# Patient Record
Sex: Female | Born: 1950 | Hispanic: No | Marital: Married | Smoking: Never smoker
Health system: Southern US, Community
[De-identification: ages and names within clinical notes are randomized; demographics above are authoritative.]

## PROBLEM LIST (undated history)

## (undated) DIAGNOSIS — I10 Essential (primary) hypertension: Secondary | ICD-10-CM

## (undated) DIAGNOSIS — E119 Type 2 diabetes mellitus without complications: Secondary | ICD-10-CM

## (undated) DIAGNOSIS — E669 Obesity, unspecified: Secondary | ICD-10-CM

## (undated) DIAGNOSIS — J449 Chronic obstructive pulmonary disease, unspecified: Secondary | ICD-10-CM

## (undated) DIAGNOSIS — I509 Heart failure, unspecified: Secondary | ICD-10-CM

## (undated) HISTORY — PX: CHOLECYSTECTOMY: SHX55

---

## 2015-08-02 ENCOUNTER — Other Ambulatory Visit (HOSPITAL_COMMUNITY): Payer: Medicare Other

## 2015-08-02 ENCOUNTER — Encounter (HOSPITAL_COMMUNITY): Payer: Self-pay | Admitting: Emergency Medicine

## 2015-08-02 ENCOUNTER — Emergency Department (HOSPITAL_COMMUNITY): Payer: Medicare Other

## 2015-08-02 ENCOUNTER — Inpatient Hospital Stay (HOSPITAL_COMMUNITY)
Admission: EM | Admit: 2015-08-02 | Discharge: 2015-08-04 | DRG: 194 | Disposition: A | Discharge status: Home / Self Care | Payer: Medicare Other | Attending: Internal Medicine | Admitting: Internal Medicine

## 2015-08-02 DIAGNOSIS — Z6841 Body Mass Index (BMI) 40.0 and over, adult: Secondary | ICD-10-CM | POA: Diagnosis not present

## 2015-08-02 DIAGNOSIS — Z87891 Personal history of nicotine dependence: Secondary | ICD-10-CM

## 2015-08-02 DIAGNOSIS — E785 Hyperlipidemia, unspecified: Secondary | ICD-10-CM | POA: Diagnosis present

## 2015-08-02 DIAGNOSIS — Z794 Long term (current) use of insulin: Secondary | ICD-10-CM

## 2015-08-02 DIAGNOSIS — I10 Essential (primary) hypertension: Secondary | ICD-10-CM | POA: Diagnosis present

## 2015-08-02 DIAGNOSIS — R600 Localized edema: Secondary | ICD-10-CM | POA: Diagnosis present

## 2015-08-02 DIAGNOSIS — R0902 Hypoxemia: Secondary | ICD-10-CM

## 2015-08-02 DIAGNOSIS — J189 Pneumonia, unspecified organism: Secondary | ICD-10-CM | POA: Diagnosis present

## 2015-08-02 DIAGNOSIS — E669 Obesity, unspecified: Secondary | ICD-10-CM | POA: Diagnosis present

## 2015-08-02 DIAGNOSIS — J449 Chronic obstructive pulmonary disease, unspecified: Secondary | ICD-10-CM | POA: Diagnosis present

## 2015-08-02 DIAGNOSIS — E119 Type 2 diabetes mellitus without complications: Secondary | ICD-10-CM | POA: Diagnosis present

## 2015-08-02 DIAGNOSIS — R0600 Dyspnea, unspecified: Secondary | ICD-10-CM | POA: Diagnosis not present

## 2015-08-02 DIAGNOSIS — G4730 Sleep apnea, unspecified: Secondary | ICD-10-CM | POA: Diagnosis present

## 2015-08-02 DIAGNOSIS — Z79899 Other long term (current) drug therapy: Secondary | ICD-10-CM | POA: Diagnosis not present

## 2015-08-02 HISTORY — DX: Chronic obstructive pulmonary disease, unspecified: J44.9

## 2015-08-02 HISTORY — DX: Type 2 diabetes mellitus without complications: E11.9

## 2015-08-02 HISTORY — DX: Essential (primary) hypertension: I10

## 2015-08-02 LAB — GLUCOSE, CAPILLARY
GLUCOSE-CAPILLARY: 270 mg/dL — AB (ref 65–99)
GLUCOSE-CAPILLARY: 264 mg/dL — AB (ref 65–99)
GLUCOSE-CAPILLARY: 238 mg/dL — AB (ref 65–99)
Glucose-Capillary: 185 mg/dL — ABNORMAL HIGH (ref 65–99)

## 2015-08-02 LAB — CBC WITH DIFFERENTIAL/PLATELET
BASOS PCT: 0 % (ref 0–1)
Basophils Absolute: 0 10*3/uL (ref 0.0–0.1)
EOS ABS: 0.3 10*3/uL (ref 0.0–0.7)
Eosinophils Relative: 4 % (ref 0–5)
HEMATOCRIT: 42 % (ref 36.0–46.0)
HEMOGLOBIN: 13.6 g/dL (ref 12.0–15.0)
Lymphocytes Relative: 21 % (ref 12–46)
Lymphs Abs: 1.6 10*3/uL (ref 0.7–4.0)
MCH: 27.9 pg (ref 26.0–34.0)
MCHC: 32.4 g/dL (ref 30.0–36.0)
MCV: 86.1 fL (ref 78.0–100.0)
MONOS PCT: 7 % (ref 3–12)
Monocytes Absolute: 0.6 10*3/uL (ref 0.1–1.0)
NEUTROS ABS: 5.2 10*3/uL (ref 1.7–7.7)
NEUTROS PCT: 68 % (ref 43–77)
Platelets: 245 10*3/uL (ref 150–400)
RBC: 4.88 MIL/uL (ref 3.87–5.11)
RDW: 15.4 % (ref 11.5–15.5)
WBC: 7.7 10*3/uL (ref 4.0–10.5)

## 2015-08-02 LAB — COMPREHENSIVE METABOLIC PANEL
ALBUMIN: 3 g/dL — AB (ref 3.5–5.0)
ALK PHOS: 90 U/L (ref 38–126)
ALT: 25 U/L (ref 14–54)
AST: 26 U/L (ref 15–41)
Anion gap: 8 (ref 5–15)
BILIRUBIN TOTAL: 0.6 mg/dL (ref 0.3–1.2)
BUN: 11 mg/dL (ref 6–20)
CALCIUM: 8.9 mg/dL (ref 8.9–10.3)
CO2: 27 mmol/L (ref 22–32)
CREATININE: 0.77 mg/dL (ref 0.44–1.00)
Chloride: 104 mmol/L (ref 101–111)
GFR calc Af Amer: >60 mL/min (ref >60)
GFR calc non Af Amer: >60 mL/min (ref >60)
GLUCOSE: 245 mg/dL — AB (ref 65–99)
Potassium: 4 mmol/L (ref 3.5–5.1)
SODIUM: 139 mmol/L (ref 135–145)
TOTAL PROTEIN: 6.4 g/dL — AB (ref 6.5–8.1)

## 2015-08-02 LAB — BRAIN NATRIURETIC PEPTIDE: B NATRIURETIC PEPTIDE 5: 36.9 pg/mL (ref 0.0–100.0)

## 2015-08-02 LAB — STREP PNEUMONIAE URINARY ANTIGEN: Strep Pneumo Urinary Antigen: NEGATIVE

## 2015-08-02 LAB — HIV ANTIBODY (ROUTINE TESTING W REFLEX): HIV SCREEN 4TH GENERATION: NONREACTIVE

## 2015-08-02 LAB — I-STAT TROPONIN, ED: Troponin i, poc: 0.01 ng/mL (ref 0.00–0.08)

## 2015-08-02 MED ORDER — ENOXAPARIN SODIUM 40 MG/0.4ML ~~LOC~~ SOLN
40.0000 mg | Freq: Every day | SUBCUTANEOUS | Status: DC
  Administered 2015-08-02 – 2015-08-04 (×3): 40 mg via SUBCUTANEOUS
  Filled 2015-08-02 (×3): qty 0.4

## 2015-08-02 MED ORDER — DEXTROSE 5 % IV SOLN
500.0000 mg | Freq: Once | INTRAVENOUS | Status: AC
  Administered 2015-08-02: 500 mg via INTRAVENOUS
  Filled 2015-08-02: qty 500

## 2015-08-02 MED ORDER — INSULIN GLARGINE 100 UNIT/ML ~~LOC~~ SOLN
40.0000 [IU] | Freq: Two times a day (BID) | SUBCUTANEOUS | Status: DC
  Administered 2015-08-02 – 2015-08-04 (×4): 40 [IU] via SUBCUTANEOUS
  Filled 2015-08-02 (×7): qty 0.4

## 2015-08-02 MED ORDER — LOSARTAN POTASSIUM 50 MG PO TABS
100.0000 mg | ORAL_TABLET | Freq: Every day | ORAL | Status: DC
  Administered 2015-08-02 – 2015-08-04 (×3): 100 mg via ORAL
  Filled 2015-08-02 (×3): qty 2

## 2015-08-02 MED ORDER — AZITHROMYCIN 250 MG PO TABS
500.0000 mg | ORAL_TABLET | ORAL | Status: DC
  Administered 2015-08-03 – 2015-08-04 (×2): 500 mg via ORAL
  Filled 2015-08-02 (×3): qty 2

## 2015-08-02 MED ORDER — INSULIN ASPART 100 UNIT/ML ~~LOC~~ SOLN
0.0000 [IU] | Freq: Three times a day (TID) | SUBCUTANEOUS | Status: DC
  Administered 2015-08-02: 11 [IU] via SUBCUTANEOUS
  Administered 2015-08-02: 4 [IU] via SUBCUTANEOUS
  Administered 2015-08-02: 7 [IU] via SUBCUTANEOUS
  Administered 2015-08-03 (×2): 3 [IU] via SUBCUTANEOUS
  Administered 2015-08-03: 6 [IU] via SUBCUTANEOUS
  Administered 2015-08-04: 3 [IU] via SUBCUTANEOUS
  Administered 2015-08-04: 4 [IU] via SUBCUTANEOUS

## 2015-08-02 MED ORDER — METFORMIN HCL 500 MG PO TABS
1000.0000 mg | ORAL_TABLET | Freq: Two times a day (BID) | ORAL | Status: DC
  Administered 2015-08-02 – 2015-08-04 (×4): 1000 mg via ORAL
  Filled 2015-08-02 (×5): qty 2

## 2015-08-02 MED ORDER — IBUPROFEN 600 MG PO TABS
600.0000 mg | ORAL_TABLET | Freq: Four times a day (QID) | ORAL | Status: DC | PRN
  Administered 2015-08-02 – 2015-08-04 (×4): 600 mg via ORAL
  Filled 2015-08-02 (×4): qty 1

## 2015-08-02 MED ORDER — ALBUTEROL SULFATE (2.5 MG/3ML) 0.083% IN NEBU: 2.5000 mg | INHALATION_SOLUTION | Freq: Four times a day (QID) | RESPIRATORY_TRACT | Status: DC | PRN

## 2015-08-02 MED ORDER — DEXTROSE 5 % IV SOLN
1.0000 g | INTRAVENOUS | Status: DC
  Administered 2015-08-03 – 2015-08-04 (×2): 1 g via INTRAVENOUS
  Filled 2015-08-02 (×3): qty 10

## 2015-08-02 MED ORDER — ASPIRIN EC 81 MG PO TBEC
81.0000 mg | DELAYED_RELEASE_TABLET | Freq: Every day | ORAL | Status: DC
  Administered 2015-08-02 – 2015-08-04 (×3): 81 mg via ORAL
  Filled 2015-08-02 (×3): qty 1

## 2015-08-02 MED ORDER — BUDESONIDE-FORMOTEROL FUMARATE 160-4.5 MCG/ACT IN AERO
2.0000 | INHALATION_SPRAY | Freq: Two times a day (BID) | RESPIRATORY_TRACT | Status: DC
  Administered 2015-08-02 – 2015-08-04 (×5): 2 via RESPIRATORY_TRACT
  Filled 2015-08-02: qty 6

## 2015-08-02 MED ORDER — DEXTROSE 5 % IV SOLN
1.0000 g | Freq: Once | INTRAVENOUS | Status: AC
  Administered 2015-08-02: 1 g via INTRAVENOUS
  Filled 2015-08-02: qty 10

## 2015-08-02 MED ORDER — IPRATROPIUM-ALBUTEROL 0.5-2.5 (3) MG/3ML IN SOLN
3.0000 mL | Freq: Once | RESPIRATORY_TRACT | Status: AC
  Administered 2015-08-02: 3 mL via RESPIRATORY_TRACT
  Filled 2015-08-02: qty 3

## 2015-08-02 MED ORDER — INSULIN ASPART 100 UNIT/ML ~~LOC~~ SOLN
6.0000 [IU] | Freq: Three times a day (TID) | SUBCUTANEOUS | Status: DC
  Administered 2015-08-02 – 2015-08-03 (×6): 6 [IU] via SUBCUTANEOUS

## 2015-08-02 MED ORDER — CETYLPYRIDINIUM CHLORIDE 0.05 % MT LIQD
7.0000 mL | Freq: Two times a day (BID) | OROMUCOSAL | Status: DC
  Administered 2015-08-02 – 2015-08-03 (×4): 7 mL via OROMUCOSAL

## 2015-08-02 MED ORDER — AMLODIPINE BESYLATE 10 MG PO TABS
10.0000 mg | ORAL_TABLET | Freq: Every day | ORAL | Status: DC
  Administered 2015-08-02 – 2015-08-04 (×3): 10 mg via ORAL
  Filled 2015-08-02 (×3): qty 1

## 2015-08-02 NOTE — Progress Notes (Signed)
Admission Note  Patient arrived to floor in room 5W06 via bed from ED. Patient alert and oriented X 4. Vitals signs  Oral temperature 98.2 F       Blood pressure 150/54       Pulse 787       RR 18       SpO2 96 % on 2L of Oxygen. Denies pain. Skin intact, no pressure ulcer noted in sacral area. Telemetry monitor #16 placed.  Patient's ID armband verified with patient/ family, and in place. Information packet given to patient/ family. Fall risk assessed, SR up X2, patient/ family able to verbalize understanding of risks associated with falls and to call nurse or staff to assist before getting out of bed. Patient/ family oriented to room and equipment. Call bell within reach.

## 2015-08-02 NOTE — Progress Notes (Signed)
Patient is seen & examined. Please see today's H&P for the details. 64 y/o female with PMH of DM, COPD, HTN presented with cough, SOB, Fever. Admitted with developing PNA. Started on IV antibiotics. She is clinically stable.  -Patient also reports chronic DOE. No acute chest pains. We will obtain echo. ? Underlying OSA. Also need outpatient PFTs BURIEV, ULUGBEK N 9:47 AM

## 2015-08-02 NOTE — Progress Notes (Signed)
Called and received report on patient from [facility] ED nurse.

## 2015-08-02 NOTE — Care Management Note (Signed)
Case Management Note  Patient Details  Name: Monay Houlton MRN: [id number] Date of Birth: [birth year]  Subjective/Objective:                 Patient from home with spouse. She currently does no have home oxygen, she receives her diabetes supplies from [facility]. Has walker and a cane. Pateint states that she was about to start care with a new PCP in [place] and has an appointment tomorrow, but could not remember the name of the MD. She stated she would get the number from her sister so her follow up could be rescheduled.    Action/Plan:  Will anticipate home with DME oxygen unless clinical improvement made.  Expected Discharge Date:  08/04/15               Expected Discharge Plan:  Home w Home Health Services  In-House Referral:     Discharge planning Services  CM Consult  Post Acute Care Choice:    Choice offered to:  Patient  DME Arranged:    DME Agency:     HH Arranged:    HH Agency:     Status of Service:  In process, will continue to follow  Medicare Important Message Given:    Date Medicare IM Given:    Medicare IM give by:    Date Additional Medicare IM Given:    Additional Medicare Important Message give by:     If discussed at Long Length of Stay Meetings, dates discussed:    Additional Comments:  Lawerance Sabal, RN 08/02/2015, 12:27 PM

## 2015-08-02 NOTE — H&P (Signed)
[facility] History and Physical  Dorothy Hammond ZOX:[id number] DOB: 04-Jul-1951 DOA: 08/02/2015  Referring physician: EDP PCP: No primary care provider on file.   Chief Complaint: Cough   HPI: Dorothy Hammond is a 64 y.o. female with h/o DM and COPD.  Patient presents to the ED with c/o cough, SOB.  Symptoms have been persistent and worsening since onset 1.5 weeks ago.  Had a grand-daughter with similar symptoms who was treated with ABx starting 2 weeks ago.  Cough is productive of yellowish-white phlegm.  Has associated URI symptoms and fever with Tm of 103.  Review of Systems: Systems reviewed.  As above, otherwise negative  Past Medical History  Diagnosis Date  . Diabetes mellitus without complication   . COPD (chronic obstructive pulmonary disease)   . HTN (hypertension)    Past Surgical History  Procedure Laterality Date  . Cholecystectomy    . Cesarean section     Social History:  reports that she has never smoked. She does not have any smokeless tobacco history on file. She reports that she does not drink alcohol or use illicit drugs.  No Known Allergies  No family history on file.   Prior to Admission medications   Medication Sig Start Date End Date Taking? Authorizing Provider  albuterol (PROAIR HFA) 108 (90 BASE) MCG/ACT inhaler Inhale 2 puffs into the lungs every 6 (six) hours as needed for wheezing or shortness of breath.   Yes Historical Provider, MD  amLODipine (NORVASC) 10 MG tablet Take 10 mg by mouth daily.   Yes Historical Provider, MD  aspirin EC 81 MG tablet Take 81 mg by mouth daily.   Yes Historical Provider, MD  budesonide-formoterol (SYMBICORT) 160-4.5 MCG/ACT inhaler Inhale 2 puffs into the lungs 2 (two) times daily.   Yes Historical Provider, MD  ibuprofen (ADVIL,MOTRIN) 200 MG tablet Take 600 mg by mouth every 6 (six) hours as needed for fever or moderate pain.   Yes Historical Provider, MD  insulin aspart (NOVOLOG FLEXPEN) 100 UNIT/ML FlexPen Inject 20  Units into the skin 3 (three) times daily as needed (when eating a meal).   Yes Historical Provider, MD  insulin glargine (LANTUS) 100 unit/mL SOPN Inject 40 Units into the skin 2 (two) times daily.   Yes Historical Provider, MD  losartan (COZAAR) 100 MG tablet Take 100 mg by mouth daily.   Yes Historical Provider, MD  metFORMIN (GLUCOPHAGE) 500 MG tablet Take 1,000 mg by mouth 2 (two) times daily with a meal.   Yes Historical Provider, MD  trolamine salicylate (ASPERCREME) 10 % cream Apply 1 application topically 2 (two) times daily as needed for muscle pain.   Yes Historical Provider, MD   Physical Exam: Filed Vitals:   08/02/15 0446  BP: 142/55  Pulse: 74  Temp:   Resp: 16    BP 142/55 mmHg  Pulse 74  Temp(Src) 98.9 F (37.2 C) (Oral)  Resp 16  SpO2 93%  General Appearance:    Alert, oriented, no distress, appears stated age  Head:    Normocephalic, atraumatic  Eyes:    PERRL, EOMI, sclera non-icteric        Nose:   Nares without drainage or epistaxis. Mucosa, turbinates normal  Throat:   Moist mucous membranes. Oropharynx without erythema or exudate.  Neck:   Supple. No carotid bruits.  No thyromegaly.  No lymphadenopathy.   Back:     No CVA tenderness, no spinal tenderness  Lungs:     Clear to auscultation bilaterally, without  wheezes, rhonchi or rales  Chest wall:    No tenderness to palpitation  Heart:    Regular rate and rhythm without murmurs, gallops, rubs  Abdomen:     Soft, non-tender, nondistended, normal bowel sounds, no organomegaly  Genitalia:    deferred  Rectal:    deferred  Extremities:   No clubbing, cyanosis or edema.  Pulses:   2+ and symmetric all extremities  Skin:   Skin color, texture, turgor normal, no rashes or lesions  Lymph nodes:   Cervical, supraclavicular, and axillary nodes normal  Neurologic:   CNII-XII intact. Normal strength, sensation and reflexes      throughout    Labs on Admission:  Basic Metabolic Panel:  Recent Labs Lab  08/02/15 0203  NA 139  K 4.0  CL 104  CO2 27  GLUCOSE 245*  BUN 11  CREATININE 0.77  CALCIUM 8.9   Liver Function Tests:  Recent Labs Lab 08/02/15 0203  AST 26  ALT 25  ALKPHOS 90  BILITOT 0.6  PROT 6.4*  ALBUMIN 3.0*   No results for input(s): LIPASE, AMYLASE in the last 168 hours. No results for input(s): AMMONIA in the last 168 hours. CBC:  Recent Labs Lab 08/02/15 0203  WBC 7.7  NEUTROABS 5.2  HGB 13.6  HCT 42.0  MCV 86.1  PLT 245   Cardiac Enzymes: No results for input(s): CKTOTAL, CKMB, CKMBINDEX, TROPONINI in the last 168 hours.  BNP (last 3 results) No results for input(s): PROBNP in the last 8760 hours. CBG: No results for input(s): GLUCAP in the last 168 hours.  Radiological Exams on Admission: Dg Chest 2 View  08/02/2015   CLINICAL DATA:  64 year old female with shortness of breath and chest discomfort x2 weeks.  EXAM: CHEST  2 VIEW  COMPARISON:  None.  FINDINGS: Two views of the chest demonstrate mild congestive changes with bilateral lower lung field interstitial prominence likely atelectatic changes. Superimposed pneumonia is not excluded. There is no focal consolidation. No significant pleural effusion or pneumothorax. Top-normal cardiac size. The osseous structures are grossly unremarkable.  IMPRESSION: Mild congestive changes.  No focal consolidation.   Electronically Signed   By: Elgie Collard M.D.   On: 08/02/2015 02:35    EKG: Independently reviewed.  Assessment/Plan Principal Problem:   CAP (community acquired pneumonia) Active Problems:   DM2 (diabetes mellitus, type 2)   HTN (hypertension), benign   1. CAP -  1. PNA pathway 2. Rocephin and azithromycin 3. Cultures pending 2. DM 2 - 1. Continue home lantus 2. Will put patient on 6 novolog with meals and high dose SSI instead of 20 units TID WC 3. CBG checks AC/HS 3. HTN - continue home meds    Code Status: Full  Family Communication: No family in room Disposition Plan:  Admit to inpatient   Time spent: 70 min  GARDNER, JARED M. [facility] Pager [phone]  If 7AM-7PM, please contact the day team taking care of the patient [email or web address] Password [email or web address] 08/02/2015, 5:18 AM

## 2015-08-02 NOTE — Progress Notes (Signed)
Patient's CBG 270. Patient stated she would not take any insulin now if MD orders, stated that her BG tends to drop in the morning. Patient has metformin and insulin scheduled at 0800. K. Schorr MD paged and notified.

## 2015-08-02 NOTE — ED Provider Notes (Signed)
TIME SEEN: 1:49 AM   CHIEF COMPLAINT: Cough  HPI: HPI Comments: Dorothy Hammond is a 64 y.o. female former smoker with a PMHx of hypertension, hyperlipidemia, DM and COPD who presents to the Emergency Department complaining of a worsening, productive (yellowish-white phlegm) cough onset 1.5 weeks ago. She reports associated rhinorrhea, congestion, and fever (Tmax 103). She has had a sick contact of the same (7-month-old granddaughter), who was put on antibiotics. She notes she has had PNA last March reports is still similar. Pt does not have oxygen at home. Pt denies n/v/d. Denies history of regular tobacco use. States she would smoke cigarettes intermittently but has not in many years.  ROS: See HPI Constitutional: fever  Eyes: no drainage  ENT: runny nose   Cardiovascular:  no chest pain  Resp:  SOB  GI: no vomiting GU: no dysuria Integumentary: no rash  Allergy: no hives  Musculoskeletal: no leg swelling  Neurological: no slurred speech ROS otherwise negative  PAST MEDICAL HISTORY/PAST SURGICAL HISTORY:  Past Medical History  Diagnosis Date  . Diabetes mellitus without complication   . COPD (chronic obstructive pulmonary disease)     MEDICATIONS:  Prior to Admission medications   Not on File    ALLERGIES:  No Known Allergies  SOCIAL HISTORY:  Social History  Substance Use Topics  . Smoking status: Never Smoker   . Smokeless tobacco: Not on file  . Alcohol Use: No    FAMILY HISTORY: No family history on file.  EXAM: BP 148/56 mmHg  Pulse 76  Temp(Src) 98.5 F (36.9 C) (Oral)  Resp 20  SpO2 95% CONSTITUTIONAL: Alert and oriented and responds appropriately to questions. Well-appearing; pt is obese, in no apparent distress, afebrile HEAD: Normocephalic EYES: Conjunctivae clear, PERRL ENT: normal nose; no rhinorrhea; moist mucous membranes; pharynx without lesions noted NECK: Supple, no meningismus, no LAD  CARD: RRR; S1 and S2 appreciated; no murmurs, no clicks,  no rubs, no gallops RESP: Normal chest excursion without splinting or tachypnea; breath sounds clear and no rhonchi, no rales, respiratory distress, speaking full sentences, pt is hypoxic on room air, wheezes bilaterally ABD/GI: Normal bowel sounds; non-distended; soft, non-tender, no rebound, no guarding, no peritoneal signs BACK:  The back appears normal and is non-tender to palpation, there is no CVA tenderness EXT: Normal ROM in all joints; non-tender to palpation; no edema; normal capillary refill; no cyanosis, no calf tenderness or swelling    SKIN: Normal color for age and race; warm NEURO: Moves all extremities equally, sensation to light touch intact diffusely, cranial nerves II through XII intact PSYCH: The patient's mood and manner are appropriate. Grooming and personal hygiene are appropriate.  MEDICAL DECISION MAKING: Patient here with fever at home, cough. She does have a new oxygen requirement. States this feels similar to her prior pneumonia. No history of PE or DVT. Will obtain labs, chest x-ray. Anticipate admission. She does not currently have a primary care provider.  ED PROGRESS: Patient's chest x-ray shows pneumonia. Labs unremarkable. Troponin negative. BNP normal. We'll give IV ceftriaxone and azithromycin. Blood cultures pending. Will admit.   3:50 AM  D/w Dr. Julian Reil with hospitalist service who agrees on admission to telemetry. Updated patient and family.   EKG Interpretation  Date/Time:  Tuesday August 02 2015 02:07:12 EDT Ventricular Rate:  75 PR Interval:  167 QRS Duration: 136 QT Interval:  422 QTC Calculation: 471 R Axis:   -81 Text Interpretation:  Sinus rhythm RBBB and LAFB Baseline wander in lead(s) V2 No  old tracing to compare Confirmed by Toluwani Yadav,  DO, Naureen Benton [phone]) on 08/02/2015 2:11:02 AM          I personally performed the services described in this documentation, which was scribed in my presence. The recorded information has been reviewed and  is accurate.   Layla Maw Finas Delone, DO 08/02/15 [phone]

## 2015-08-02 NOTE — ED Notes (Signed)
Pt. reports persistent productive cough with fever, runny nose and nasal congestion onset 1 1/2 weeks ago .

## 2015-08-03 ENCOUNTER — Inpatient Hospital Stay (HOSPITAL_COMMUNITY): Payer: Medicare Other

## 2015-08-03 DIAGNOSIS — R0600 Dyspnea, unspecified: Secondary | ICD-10-CM

## 2015-08-03 DIAGNOSIS — I10 Essential (primary) hypertension: Secondary | ICD-10-CM

## 2015-08-03 DIAGNOSIS — J189 Pneumonia, unspecified organism: Principal | ICD-10-CM

## 2015-08-03 DIAGNOSIS — E119 Type 2 diabetes mellitus without complications: Secondary | ICD-10-CM

## 2015-08-03 LAB — GLUCOSE, CAPILLARY
GLUCOSE-CAPILLARY: 104 mg/dL — AB (ref 65–99)
GLUCOSE-CAPILLARY: 131 mg/dL — AB (ref 65–99)
GLUCOSE-CAPILLARY: 130 mg/dL — AB (ref 65–99)
Glucose-Capillary: 135 mg/dL — ABNORMAL HIGH (ref 65–99)
Glucose-Capillary: 145 mg/dL — ABNORMAL HIGH (ref 65–99)

## 2015-08-03 LAB — LEGIONELLA ANTIGEN, URINE

## 2015-08-03 LAB — HEMOGLOBIN A1C
Hgb A1c MFr Bld: 8.7 % — ABNORMAL HIGH (ref 4.8–5.6)
MEAN PLASMA GLUCOSE: 203 mg/dL

## 2015-08-03 NOTE — Progress Notes (Signed)
Patient texted daughter while CM in room to obtain phone number and/ or name of PCP. Will continue to follow for response.

## 2015-08-03 NOTE — Progress Notes (Addendum)
[facility]                                                                              Patient Demographics  Dorothy Hammond, is a 64 y.o. female, DOB - 05-Nov-1951, ZOX:[id number]  Admit date - 08/02/2015   Admitting Physician Hillary Bow, DO  Outpatient Primary MD for the patient is No primary care provider on file.  LOS - 1   Chief Complaint  Patient presents with  . Cough      HPI on 08/02/2015 by Dr. Lyda Perone Dorothy Hammond is a 64 y.o. female with h/o DM and COPD. Patient presents to the ED with c/o cough, SOB. Symptoms have been persistent and worsening since onset 1.5 weeks ago. Had a grand-daughter with similar symptoms who was treated with ABx starting 2 weeks ago. Cough is productive of yellowish-white phlegm. Has associated URI symptoms and fever with Tm of 103.  Assessment & Plan   Dyspnea secondary to ? community-acquired pneumonia -Chest x-ray showed mild congestive changes, no focal consolidation -Currently afebrile, no leukocytosis -Continue azithromycin, ceftriaxone -Echocardiogram pending -Strep pneumonia urine antigen negative, Legionella urine antigen pending -Blood cultures pending  Hypertension -Continue amlodipine, losartan  Diabetes mellitus, type II -Continue metformin, insulin sliding scale, CBG monitoring  Lower extremity edema -Likely multifactorial including obesity, questionable sleep apnea, amlodipine -Recommended patient follow-up with pulmonology for PFTs/possible sleep study as an outpatient -Echocardiogram pending  Code Status: Full  Family Communication: None at bedside  Disposition Plan: Admitted  Time Spent in minutes   30 minutes  Procedures  None  Consults   None  DVT Prophylaxis  Lovenox  Lab Results  Component Value Date   PLT 245 08/02/2015    Medications  Scheduled Meds: . amLODipine  10 mg Oral Daily  . antiseptic oral rinse  7 mL Mouth Rinse BID  . aspirin EC  81 mg Oral Daily  . azithromycin   500 mg Oral Q24H  . budesonide-formoterol  2 puff Inhalation BID  . cefTRIAXone (ROCEPHIN)  IV  1 g Intravenous Q24H  . enoxaparin (LOVENOX) injection  40 mg Subcutaneous Daily  . insulin aspart  0-20 Units Subcutaneous TID WC  . insulin aspart  6 Units Subcutaneous TID WC  . insulin glargine  40 Units Subcutaneous BID  . losartan  100 mg Oral Daily  . metFORMIN  1,000 mg Oral BID WC   Continuous Infusions:  PRN Meds:.albuterol, ibuprofen  Antibiotics     Anti-infectives    Start     Dose/Rate Route Frequency Ordered Stop   08/03/15 0800  cefTRIAXone (ROCEPHIN) 1 g in dextrose 5 % 50 mL IVPB     1 g 100 mL/hr over 30 Minutes Intravenous Every 24 hours 08/02/15 0512 08/10/15 0759   08/03/15 0800  azithromycin (ZITHROMAX) tablet 500 mg     500 mg Oral Every 24 hours 08/02/15 0512 08/10/15 0759   08/02/15 0245  cefTRIAXone (ROCEPHIN) 1 g in dextrose 5 % 50 mL IVPB     1 g 100 mL/hr over 30 Minutes Intravenous  Once 08/02/15 0242 08/02/15 0514   08/02/15 0245  azithromycin (ZITHROMAX) 500 mg in dextrose 5 % 250 mL IVPB  500 mg 250 mL/hr over 60 Minutes Intravenous  Once 08/02/15 0242 08/02/15 0443        Subjective:   Dorothy Hammond seen and examined today.  Patient states she is feeling somewhat better. Continues to complain of cough. Feels her breathing is improved mildly. Denies any chest pain. Does complain of lower extremity swelling which has been ongoing. Denies abdominal pain, nausea, vomiting, diarrhea or constipation.  Objective:   Filed Vitals:   08/02/15 2106 08/02/15 2109 08/03/15 0455 08/03/15 0458  BP: 119/38 123/52 125/43 115/41  Pulse: 76 72 69 70  Temp: 98.7 F (37.1 C)  97.8 F (36.6 C)   TempSrc: Oral  Oral   Resp: 18  18   Height:      Weight:      SpO2: 97% 96% 93% 93%    Wt Readings from Last 3 Encounters:  08/02/15 117.8 kg (259 lb 11.2 oz)     Intake/Output Summary (Last 24 hours) at 08/03/15 1210 Last data filed at 08/03/15 0900  Gross  per 24 hour  Intake    240 ml  Output    200 ml  Net     40 ml    Exam  General: Well developed, well nourished, NAD, appears stated age  HEENT: NCAT,  mucous membranes moist.   Cardiovascular: S1 S2 auscultated, no rubs, murmurs or gallops. Regular rate and rhythm.  Respiratory: Diminished but clear breath sounds. No wheezing.  Abdomen: Soft, obese, nontender, nondistended, + bowel sounds  Extremities: warm dry without cyanosis clubbing. +LE edema B?L  Neuro: AAOx3, nonfocal.  Data Review   Micro Results No results found for this or any previous visit (from the past 240 hour(s)).  Radiology Reports Dg Chest 2 View  08/02/2015   CLINICAL DATA:  64 year old female with shortness of breath and chest discomfort x2 weeks.  EXAM: CHEST  2 VIEW  COMPARISON:  None.  FINDINGS: Two views of the chest demonstrate mild congestive changes with bilateral lower lung field interstitial prominence likely atelectatic changes. Superimposed pneumonia is not excluded. There is no focal consolidation. No significant pleural effusion or pneumothorax. Top-normal cardiac size. The osseous structures are grossly unremarkable.  IMPRESSION: Mild congestive changes.  No focal consolidation.   Electronically Signed   By: Elgie Collard M.D.   On: 08/02/2015 02:35    CBC  Recent Labs Lab 08/02/15 0203  WBC 7.7  HGB 13.6  HCT 42.0  PLT 245  MCV 86.1  MCH 27.9  MCHC 32.4  RDW 15.4  LYMPHSABS 1.6  MONOABS 0.6  EOSABS 0.3  BASOSABS 0.0    Chemistries   Recent Labs Lab 08/02/15 0203  NA 139  K 4.0  CL 104  CO2 27  GLUCOSE 245*  BUN 11  CREATININE 0.77  CALCIUM 8.9  AST 26  ALT 25  ALKPHOS 90  BILITOT 0.6   ------------------------------------------------------------------------------------------------------------------ estimated creatinine clearance is 74.7 mL/min (by C-G formula based on Cr of  0.77). ------------------------------------------------------------------------------------------------------------------  Recent Labs  08/02/15 1139  HGBA1C 8.7*   ------------------------------------------------------------------------------------------------------------------ No results for input(s): CHOL, HDL, LDLCALC, TRIG, CHOLHDL, LDLDIRECT in the last 72 hours. ------------------------------------------------------------------------------------------------------------------ No results for input(s): TSH, T4TOTAL, T3FREE, THYROIDAB in the last 72 hours.  Invalid input(s): FREET3 ------------------------------------------------------------------------------------------------------------------ No results for input(s): VITAMINB12, FOLATE, FERRITIN, TIBC, IRON, RETICCTPCT in the last 72 hours.  Coagulation profile No results for input(s): INR, PROTIME in the last 168 hours.  No results for input(s): DDIMER in the last 72 hours.  Cardiac Enzymes  No results for input(s): CKMB, TROPONINI, MYOGLOBIN in the last 168 hours.  Invalid input(s): CK ------------------------------------------------------------------------------------------------------------------ Invalid input(s): POCBNP    Duncan Alejandro D.O. on 08/03/2015 at 12:10 PM  Between 7am to 7pm - Pager - [phone]  After 7pm go to [email or web address] - password [email or web address]  And look for the night coverage person covering for me after hours  [facility] Office  [phone]

## 2015-08-03 NOTE — Consult Note (Signed)
Dorothy Wilson, EdD 

## 2015-08-03 NOTE — Progress Notes (Signed)
  Echocardiogram 2D Echocardiogram has been performed.  Arvil Chaco 08/03/2015, 11:35 AM

## 2015-08-03 NOTE — Progress Notes (Signed)
PCP is Dr Patria Mane [phone]

## 2015-08-04 DIAGNOSIS — R600 Localized edema: Secondary | ICD-10-CM

## 2015-08-04 LAB — CBC
HCT: 43.3 % (ref 36.0–46.0)
HEMOGLOBIN: 13.6 g/dL (ref 12.0–15.0)
MCH: 27.4 pg (ref 26.0–34.0)
MCHC: 31.4 g/dL (ref 30.0–36.0)
MCV: 87.1 fL (ref 78.0–100.0)
PLATELETS: 268 10*3/uL (ref 150–400)
RBC: 4.97 MIL/uL (ref 3.87–5.11)
RDW: 15.3 % (ref 11.5–15.5)
WBC: 7.8 10*3/uL (ref 4.0–10.5)

## 2015-08-04 LAB — BASIC METABOLIC PANEL
ANION GAP: 6 (ref 5–15)
BUN: 9 mg/dL (ref 6–20)
CALCIUM: 8.8 mg/dL — AB (ref 8.9–10.3)
CO2: 30 mmol/L (ref 22–32)
Chloride: 103 mmol/L (ref 101–111)
Creatinine, Ser: 0.75 mg/dL (ref 0.44–1.00)
GLUCOSE: 132 mg/dL — AB (ref 65–99)
POTASSIUM: 5 mmol/L (ref 3.5–5.1)
SODIUM: 139 mmol/L (ref 135–145)

## 2015-08-04 LAB — GLUCOSE, CAPILLARY
GLUCOSE-CAPILLARY: 129 mg/dL — AB (ref 65–99)
GLUCOSE-CAPILLARY: 167 mg/dL — AB (ref 65–99)

## 2015-08-04 MED ORDER — LEVOFLOXACIN 750 MG PO TABS: 750.0000 mg | ORAL_TABLET | Freq: Every day | ORAL | Status: DC

## 2015-08-04 NOTE — Care Management Note (Signed)
Case Management Note  Patient Details  Name: Elisia Stepp MRN: [id number] Date of Birth: January 23, 1951  Subjective/Objective:                 Patient discharging on room air, PCP Dr Patria Mane office to call and schedule 1 week follow up with patient.    Action/Plan:   Expected Discharge Date:  08/04/15               Expected Discharge Plan:  Home/Self Care  In-House Referral:     Discharge planning Services  CM Consult  Post Acute Care Choice:    Choice offered to:     DME Arranged:    DME Agency:     HH Arranged:    HH Agency:     Status of Service:  Completed, signed off  Medicare Important Message Given:    Date Medicare IM Given:    Medicare IM give by:    Date Additional Medicare IM Given:    Additional Medicare Important Message give by:     If discussed at Long Length of Stay Meetings, dates discussed:    Additional Comments:  Lawerance Sabal, RN 08/04/2015, 11:09 AM

## 2015-08-04 NOTE — Progress Notes (Signed)
Pt left in wc w spouse 

## 2015-08-04 NOTE — Progress Notes (Signed)
PIV removed-pt tol well. Disch instructions, f/u appt, rx instr given to pt. Pt states understanding. Waiting for ride- husband.

## 2015-08-04 NOTE — Discharge Summary (Signed)
Physician Discharge Summary  Dorothy Hammond ZOX:[id number] DOB: 25-Feb-1951 DOA: 08/02/2015  PCP: No primary care provider on file.  Admit date: 08/02/2015 Discharge date: 08/04/2015  Time spent: 45 minutes  Recommendations for Outpatient Follow-up:  Patient will be discharged to home.  Patient will need to follow up with primary care provider within one week of discharge, discuss sleep study/PFTs and diabetes management.  Patient should continue medications as prescribed.  Patient should follow a heart healthy/carb modified diet.   Discharge Diagnoses:  Dyspnea secondary to community acquired pneumonia Hypertension Diabetes mellitus, type II Lower extremity edema  Discharge Condition: Stable  Diet recommendation: Heart healthy/carb modified  Filed Weights   08/02/15 0524  Weight: 117.8 kg (259 lb 11.2 oz)    History of present illness:  on 08/02/2015 by Dr. Lyda Perone Dorothy Hammond is a 64 y.o. female with h/o DM and COPD. Patient presents to the ED with c/o cough, SOB. Symptoms have been persistent and worsening since onset 1.5 weeks ago. Had a grand-daughter with similar symptoms who was treated with ABx starting 2 weeks ago. Cough is productive of yellowish-white phlegm. Has associated URI symptoms and fever with Tm of 103.  Hospital Course:  Dyspnea secondary to ? community-acquired pneumonia -Dyspnea improving, patient no longer on supplemental oxygen.. -Chest x-ray showed mild congestive changes, no focal consolidation -Currently afebrile, no leukocytosis -Was placed on azithromycin and ceftriaxone.  Will discharge patient with Levaquin for an additional 3 days -Echocardiogram: EF 65-70%, grade 1 diastolic dysfunction -Strep pneumonia and legionella urine antigens negative -Blood cultures show no growth to date  Hypertension -Continue amlodipine, losartan  Diabetes mellitus, type II -Continue metformin, insulin sliding scale, CBG monitoring -Hemoglobin A1c 8.7  Lower  extremity edema -Likely multifactorial including obesity, questionable sleep apnea, amlodipine -Recommended patient follow-up with pulmonology for PFTs/possible sleep study as an outpatient -Echocardiogram as noted above  Procedures  Echocardiogram  Consults  None  Discharge Exam: Filed Vitals:   08/04/15 0624  BP: 138/48  Pulse: 62  Temp: 98.7 F (37.1 C)  Resp: 20   Exam  General: Well developed, well nourished, NAD  HEENT: NCAT, mucous membranes moist.   Cardiovascular: S1 S2 auscultated, RRR, no murmurs  Respiratory: Diminished but clear breath sounds. No wheezing. Occ cough  Abdomen: Soft, obese, nontender, nondistended, + bowel sounds  Extremities: warm dry without cyanosis clubbing. +LE edema B/L  Neuro: AAOx3, nonfocal.  Discharge Instructions      Discharge Instructions    Discharge instructions    Complete by:  As directed   Patient will be discharged to home.  Patient will need to follow up with primary care provider within one week of discharge, discuss sleep study/PFTs and diabetes management.  Patient should continue medications as prescribed.  Patient should follow a heart healthy/carb modified diet.            Medication List    TAKE these medications        amLODipine 10 MG tablet  Commonly known as:  NORVASC  Take 10 mg by mouth daily.     aspirin EC 81 MG tablet  Take 81 mg by mouth daily.     budesonide-formoterol 160-4.5 MCG/ACT inhaler  Commonly known as:  SYMBICORT  Inhale 2 puffs into the lungs 2 (two) times daily.     ibuprofen 200 MG tablet  Commonly known as:  ADVIL,MOTRIN  Take 600 mg by mouth every 6 (six) hours as needed for fever or moderate pain.     insulin  glargine 100 unit/mL Sopn  Commonly known as:  LANTUS  Inject 40 Units into the skin 2 (two) times daily.     levofloxacin 750 MG tablet  Commonly known as:  LEVAQUIN  Take 1 tablet (750 mg total) by mouth daily.     losartan 100 MG tablet  Commonly  known as:  COZAAR  Take 100 mg by mouth daily.     metFORMIN 500 MG tablet  Commonly known as:  GLUCOPHAGE  Take 1,000 mg by mouth 2 (two) times daily with a meal.     NOVOLOG FLEXPEN 100 UNIT/ML FlexPen  Generic drug:  insulin aspart  Inject 20 Units into the skin 3 (three) times daily as needed (when eating a meal).     PROAIR HFA 108 (90 BASE) MCG/ACT inhaler  Generic drug:  albuterol  Inhale 2 puffs into the lungs every 6 (six) hours as needed for wheezing or shortness of breath.     trolamine salicylate 10 % cream  Commonly known as:  ASPERCREME  Apply 1 application topically 2 (two) times daily as needed for muscle pain.       No Known Allergies Follow-up Information    Follow up with CAMPOS,CLAUDIA, MD In 1 week.   Why:  or [phone] PCP for follow up   Contact information:   [address] [place] [address] [phone]        The results of significant diagnostics from this hospitalization (including imaging, microbiology, ancillary and laboratory) are listed below for reference.    Significant Diagnostic Studies: Dg Chest 2 View  08/02/2015   CLINICAL DATA:  64 year old female with shortness of breath and chest discomfort x2 weeks.  EXAM: CHEST  2 VIEW  COMPARISON:  None.  FINDINGS: Two views of the chest demonstrate mild congestive changes with bilateral lower lung field interstitial prominence likely atelectatic changes. Superimposed pneumonia is not excluded. There is no focal consolidation. No significant pleural effusion or pneumothorax. Top-normal cardiac size. The osseous structures are grossly unremarkable.  IMPRESSION: Mild congestive changes.  No focal consolidation.   Electronically Signed   By: Elgie Collard M.D.   On: 08/02/2015 02:35    Microbiology: Recent Results (from the past 240 hour(s))  Blood culture (routine x 2)     Status: None (Preliminary result)   Collection Time: 08/02/15  2:50 AM  Result Value Ref Range Status    Specimen Description BLOOD RIGHT ARM  Final   Special Requests BOTTLES DRAWN AEROBIC AND ANAEROBIC 5CC   Final   Culture NO GROWTH 1 DAY  Final   Report Status PENDING  Incomplete  Blood culture (routine x 2)     Status: None (Preliminary result)   Collection Time: 08/02/15  2:55 AM  Result Value Ref Range Status   Specimen Description BLOOD LEFT ARM  Final   Special Requests BOTTLES DRAWN AEROBIC AND ANAEROBIC 5CC  Final   Culture NO GROWTH 1 DAY  Final   Report Status PENDING  Incomplete     Labs: Basic Metabolic Panel:  Recent Labs Lab 08/02/15 0203 08/04/15 0714  NA 139 139  K 4.0 5.0  CL 104 103  CO2 27 30  GLUCOSE 245* 132*  BUN 11 9  CREATININE 0.77 0.75  CALCIUM 8.9 8.8*   Liver Function Tests:  Recent Labs Lab 08/02/15 0203  AST 26  ALT 25  ALKPHOS 90  BILITOT 0.6  PROT 6.4*  ALBUMIN 3.0*   No results for input(s): LIPASE, AMYLASE  in the last 168 hours. No results for input(s): AMMONIA in the last 168 hours. CBC:  Recent Labs Lab 08/02/15 0203 08/04/15 0714  WBC 7.7 7.8  NEUTROABS 5.2  --   HGB 13.6 13.6  HCT 42.0 43.3  MCV 86.1 87.1  PLT 245 268   Cardiac Enzymes: No results for input(s): CKTOTAL, CKMB, CKMBINDEX, TROPONINI in the last 168 hours. BNP: BNP (last 3 results)  Recent Labs  08/02/15 0203  BNP 36.9    ProBNP (last 3 results) No results for input(s): PROBNP in the last 8760 hours.  CBG:  Recent Labs Lab 08/03/15 0742 08/03/15 1143 08/03/15 1704 08/03/15 2141 08/04/15 0747  GLUCAP 135* 131* 130* 145* 129*       Signed:  Edsel Petrin  [facility] 08/04/2015, 10:34 AM

## 2015-08-04 NOTE — Discharge Instructions (Signed)

## 2015-08-05 LAB — LEGIONELLA ANTIGEN, URINE

## 2015-08-07 LAB — CULTURE, BLOOD (ROUTINE X 2)
CULTURE: NO GROWTH
Culture: NO GROWTH

## 2015-11-06 ENCOUNTER — Encounter (HOSPITAL_COMMUNITY): Payer: Self-pay | Admitting: Emergency Medicine

## 2015-11-06 ENCOUNTER — Inpatient Hospital Stay (HOSPITAL_COMMUNITY)
Admission: EM | Admit: 2015-11-06 | Discharge: 2015-11-10 | DRG: 292 | Disposition: A | Discharge status: Home / Self Care | Payer: Medicare Other | Attending: Internal Medicine | Admitting: Internal Medicine

## 2015-11-06 ENCOUNTER — Emergency Department (HOSPITAL_COMMUNITY): Payer: Medicare Other

## 2015-11-06 DIAGNOSIS — I10 Essential (primary) hypertension: Secondary | ICD-10-CM | POA: Diagnosis not present

## 2015-11-06 DIAGNOSIS — E119 Type 2 diabetes mellitus without complications: Secondary | ICD-10-CM | POA: Diagnosis present

## 2015-11-06 DIAGNOSIS — Z7951 Long term (current) use of inhaled steroids: Secondary | ICD-10-CM | POA: Diagnosis not present

## 2015-11-06 DIAGNOSIS — R0609 Other forms of dyspnea: Secondary | ICD-10-CM | POA: Insufficient documentation

## 2015-11-06 DIAGNOSIS — I5031 Acute diastolic (congestive) heart failure: Secondary | ICD-10-CM | POA: Diagnosis present

## 2015-11-06 DIAGNOSIS — J449 Chronic obstructive pulmonary disease, unspecified: Secondary | ICD-10-CM | POA: Diagnosis present

## 2015-11-06 DIAGNOSIS — R0600 Dyspnea, unspecified: Secondary | ICD-10-CM | POA: Diagnosis not present

## 2015-11-06 DIAGNOSIS — Z8701 Personal history of pneumonia (recurrent): Secondary | ICD-10-CM

## 2015-11-06 DIAGNOSIS — Z6841 Body Mass Index (BMI) 40.0 and over, adult: Secondary | ICD-10-CM

## 2015-11-06 DIAGNOSIS — R0981 Nasal congestion: Secondary | ICD-10-CM | POA: Insufficient documentation

## 2015-11-06 DIAGNOSIS — I5033 Acute on chronic diastolic (congestive) heart failure: Secondary | ICD-10-CM | POA: Diagnosis present

## 2015-11-06 DIAGNOSIS — Z794 Long term (current) use of insulin: Secondary | ICD-10-CM

## 2015-11-06 DIAGNOSIS — I110 Hypertensive heart disease with heart failure: Secondary | ICD-10-CM | POA: Diagnosis present

## 2015-11-06 DIAGNOSIS — I509 Heart failure, unspecified: Secondary | ICD-10-CM

## 2015-11-06 LAB — CBC WITH DIFFERENTIAL/PLATELET
Basophils Absolute: 0 10*3/uL (ref 0.0–0.1)
Basophils Relative: 0 %
EOS PCT: 2 %
Eosinophils Absolute: 0.2 10*3/uL (ref 0.0–0.7)
HCT: 43.5 % (ref 36.0–46.0)
Hemoglobin: 13.5 g/dL (ref 12.0–15.0)
LYMPHS ABS: 1.4 10*3/uL (ref 0.7–4.0)
LYMPHS PCT: 18 %
MCH: 27.1 pg (ref 26.0–34.0)
MCHC: 31 g/dL (ref 30.0–36.0)
MCV: 87.3 fL (ref 78.0–100.0)
MONO ABS: 0.7 10*3/uL (ref 0.1–1.0)
Monocytes Relative: 9 %
Neutro Abs: 5.4 10*3/uL (ref 1.7–7.7)
Neutrophils Relative %: 71 %
PLATELETS: 249 10*3/uL (ref 150–400)
RBC: 4.98 MIL/uL (ref 3.87–5.11)
RDW: 16 % — AB (ref 11.5–15.5)
WBC: 7.6 10*3/uL (ref 4.0–10.5)

## 2015-11-06 LAB — COMPREHENSIVE METABOLIC PANEL
ALT: 29 U/L (ref 14–54)
AST: 31 U/L (ref 15–41)
Albumin: 3.4 g/dL — ABNORMAL LOW (ref 3.5–5.0)
Alkaline Phosphatase: 88 U/L (ref 38–126)
Anion gap: 9 (ref 5–15)
BILIRUBIN TOTAL: 0.7 mg/dL (ref 0.3–1.2)
BUN: 10 mg/dL (ref 6–20)
CHLORIDE: 106 mmol/L (ref 101–111)
CO2: 26 mmol/L (ref 22–32)
CREATININE: 0.82 mg/dL (ref 0.44–1.00)
Calcium: 9.3 mg/dL (ref 8.9–10.3)
Glucose, Bld: 131 mg/dL — ABNORMAL HIGH (ref 65–99)
POTASSIUM: 3.8 mmol/L (ref 3.5–5.1)
Sodium: 141 mmol/L (ref 135–145)
TOTAL PROTEIN: 7.3 g/dL (ref 6.5–8.1)

## 2015-11-06 LAB — BRAIN NATRIURETIC PEPTIDE: B Natriuretic Peptide: 35 pg/mL (ref 0.0–100.0)

## 2015-11-06 LAB — I-STAT TROPONIN, ED: TROPONIN I, POC: 0 ng/mL (ref 0.00–0.08)

## 2015-11-06 LAB — GLUCOSE, CAPILLARY: GLUCOSE-CAPILLARY: 131 mg/dL — AB (ref 65–99)

## 2015-11-06 MED ORDER — POTASSIUM CHLORIDE CRYS ER 20 MEQ PO TBCR
20.0000 meq | EXTENDED_RELEASE_TABLET | Freq: Two times a day (BID) | ORAL | Status: DC
  Administered 2015-11-06 – 2015-11-10 (×8): 20 meq via ORAL
  Filled 2015-11-06 (×7): qty 1

## 2015-11-06 MED ORDER — BUDESONIDE-FORMOTEROL FUMARATE 160-4.5 MCG/ACT IN AERO
2.0000 | INHALATION_SPRAY | Freq: Two times a day (BID) | RESPIRATORY_TRACT | Status: DC
  Administered 2015-11-07 – 2015-11-10 (×7): 2 via RESPIRATORY_TRACT
  Filled 2015-11-06: qty 6

## 2015-11-06 MED ORDER — ACETAMINOPHEN 325 MG PO TABS
650.0000 mg | ORAL_TABLET | ORAL | Status: DC | PRN
  Administered 2015-11-07 – 2015-11-08 (×2): 650 mg via ORAL
  Filled 2015-11-06 (×2): qty 2

## 2015-11-06 MED ORDER — ISOSORBIDE MONONITRATE ER 30 MG PO TB24
15.0000 mg | ORAL_TABLET | Freq: Every day | ORAL | Status: DC
  Administered 2015-11-07: 15 mg via ORAL
  Filled 2015-11-06: qty 1

## 2015-11-06 MED ORDER — ALBUTEROL SULFATE (2.5 MG/3ML) 0.083% IN NEBU: 3.0000 mL | INHALATION_SOLUTION | Freq: Four times a day (QID) | RESPIRATORY_TRACT | Status: DC | PRN

## 2015-11-06 MED ORDER — INSULIN GLARGINE 100 UNIT/ML ~~LOC~~ SOLN
30.0000 [IU] | Freq: Every day | SUBCUTANEOUS | Status: DC
Start: 2015-11-06 — End: 2015-11-08
  Administered 2015-11-06 – 2015-11-07 (×2): 30 [IU] via SUBCUTANEOUS
  Filled 2015-11-06 (×3): qty 0.3

## 2015-11-06 MED ORDER — FUROSEMIDE 10 MG/ML IJ SOLN
40.0000 mg | Freq: Once | INTRAMUSCULAR | Status: AC
  Administered 2015-11-06: 40 mg via INTRAVENOUS
  Filled 2015-11-06: qty 4

## 2015-11-06 MED ORDER — INSULIN ASPART 100 UNIT/ML ~~LOC~~ SOLN
0.0000 [IU] | Freq: Three times a day (TID) | SUBCUTANEOUS | Status: DC
  Administered 2015-11-07 (×3): 7 [IU] via SUBCUTANEOUS
  Administered 2015-11-08 (×2): 4 [IU] via SUBCUTANEOUS
  Administered 2015-11-08: 15 [IU] via SUBCUTANEOUS
  Administered 2015-11-09: 4 [IU] via SUBCUTANEOUS
  Administered 2015-11-09: 7 [IU] via SUBCUTANEOUS
  Administered 2015-11-09 – 2015-11-10 (×2): 4 [IU] via SUBCUTANEOUS
  Administered 2015-11-10: 7 [IU] via SUBCUTANEOUS

## 2015-11-06 MED ORDER — FLUTICASONE PROPIONATE 50 MCG/ACT NA SUSP
1.0000 | Freq: Two times a day (BID) | NASAL | Status: DC
  Administered 2015-11-06 – 2015-11-10 (×8): 1 via NASAL
  Filled 2015-11-06: qty 16

## 2015-11-06 MED ORDER — INSULIN ASPART 100 UNIT/ML ~~LOC~~ SOLN: 0.0000 [IU] | Freq: Three times a day (TID) | SUBCUTANEOUS | Status: DC

## 2015-11-06 MED ORDER — ALBUTEROL SULFATE (2.5 MG/3ML) 0.083% IN NEBU
5.0000 mg | INHALATION_SOLUTION | Freq: Once | RESPIRATORY_TRACT | Status: DC
  Filled 2015-11-06: qty 6

## 2015-11-06 MED ORDER — SODIUM CHLORIDE 0.9 % IJ SOLN
3.0000 mL | Freq: Two times a day (BID) | INTRAMUSCULAR | Status: DC
  Administered 2015-11-06 – 2015-11-10 (×7): 3 mL via INTRAVENOUS

## 2015-11-06 MED ORDER — MUSCLE RUB 10-15 % EX CREA
1.0000 "application " | TOPICAL_CREAM | Freq: Every day | CUTANEOUS | Status: DC
  Administered 2015-11-06 – 2015-11-07 (×2): 1 via TOPICAL
  Filled 2015-11-06: qty 85

## 2015-11-06 MED ORDER — LOSARTAN POTASSIUM 25 MG PO TABS
12.5000 mg | ORAL_TABLET | Freq: Every day | ORAL | Status: DC
  Administered 2015-11-07 – 2015-11-10 (×4): 12.5 mg via ORAL
  Filled 2015-11-06 (×4): qty 1

## 2015-11-06 MED ORDER — HYDRALAZINE HCL 10 MG PO TABS
10.0000 mg | ORAL_TABLET | Freq: Three times a day (TID) | ORAL | Status: DC
  Administered 2015-11-06 – 2015-11-07 (×3): 10 mg via ORAL
  Filled 2015-11-06 (×3): qty 1

## 2015-11-06 MED ORDER — ONDANSETRON HCL 4 MG/2ML IJ SOLN: 4.0000 mg | Freq: Four times a day (QID) | INTRAMUSCULAR | Status: DC | PRN

## 2015-11-06 MED ORDER — CLOBETASOL PROPIONATE 0.05 % EX CREA
1.0000 "application " | TOPICAL_CREAM | Freq: Two times a day (BID) | CUTANEOUS | Status: DC
  Administered 2015-11-06 – 2015-11-10 (×6): 1 via TOPICAL
  Filled 2015-11-06: qty 15

## 2015-11-06 MED ORDER — ASPIRIN EC 81 MG PO TBEC
81.0000 mg | DELAYED_RELEASE_TABLET | Freq: Every day | ORAL | Status: DC
  Administered 2015-11-06 – 2015-11-09 (×4): 81 mg via ORAL
  Filled 2015-11-06 (×5): qty 1

## 2015-11-06 MED ORDER — SODIUM CHLORIDE 0.9 % IJ SOLN: 3.0000 mL | INTRAMUSCULAR | Status: DC | PRN

## 2015-11-06 MED ORDER — SODIUM CHLORIDE 0.9 % IV SOLN: 250.0000 mL | INTRAVENOUS | Status: DC | PRN

## 2015-11-06 MED ORDER — ENOXAPARIN SODIUM 40 MG/0.4ML ~~LOC~~ SOLN
40.0000 mg | SUBCUTANEOUS | Status: DC
  Administered 2015-11-07 – 2015-11-10 (×4): 40 mg via SUBCUTANEOUS
  Filled 2015-11-06 (×4): qty 0.4

## 2015-11-06 MED ORDER — FUROSEMIDE 10 MG/ML IJ SOLN
40.0000 mg | Freq: Two times a day (BID) | INTRAMUSCULAR | Status: DC
  Administered 2015-11-07 – 2015-11-10 (×7): 40 mg via INTRAVENOUS
  Filled 2015-11-06 (×7): qty 4

## 2015-11-06 MED ORDER — VITAMIN D 1000 UNITS PO TABS
2000.0000 [IU] | ORAL_TABLET | Freq: Every day | ORAL | Status: DC
  Administered 2015-11-07 – 2015-11-10 (×4): 2000 [IU] via ORAL
  Filled 2015-11-06 (×4): qty 2

## 2015-11-06 MED ORDER — TRAMADOL HCL 50 MG PO TABS
50.0000 mg | ORAL_TABLET | Freq: Two times a day (BID) | ORAL | Status: DC | PRN
  Administered 2015-11-07 – 2015-11-10 (×6): 50 mg via ORAL
  Filled 2015-11-06 (×8): qty 1

## 2015-11-06 NOTE — ED Notes (Signed)
Attempted report 

## 2015-11-06 NOTE — ED Notes (Signed)
Pt informed to let RN know when she needs to pee to keep track of output.

## 2015-11-06 NOTE — H&P (Signed)
[facility] History and Physical  Dorothy Hammond ZHY:[id number] DOB: 21-Jan-1951 DOA: 11/06/2015  Referring physician: ED physician PCP: Aldona Bar, MD  Specialists:   Chief Complaint:   HPI: Dorothy Hammond is a 64 y.o. female with PMH of hypertension, diabetes, and BMI 64 who presents to the ED with worsening dyspnea, orthopnea, and lower extremity edema. Patient was admitted for pneumonia in September this year and states that since time of discharge, she is never made a full recovery from a respiratory standpoint. She was seen by pulmonologist at [facility] in November this year where she had spirometry that was reportedly abnormal but negative for COPD. She's been taking Symbicort and albuterol for the last 3 months, but despite this her dyspnea is continuing to worsen. Over this interval, she is also developed worsening lower extremity edema and orthopnea. She reports gaining close to 20 pounds in the last 3 months. Symptoms have become so bad over the last few days that she's been essentially immobile, prompting her visit to the ED. She denies fevers, chills, chest pains, palpitations. She denies cough or sick contacts. There is been no recent travel and no unilateral leg pain or swelling.   In ED, patient was found to  be afebrile and didn't appear in discomfort, but no acute respiratory distress. Chem panel and CBC were unremarkable, troponin was undetectable, and EKG demonstrated a sinus rhythm with chronic right bundle branch block that was unchanged from priors. A 2 view chest x-ray was obtained which demonstrated central pulmonary vascular congestion with pulmonary edema. She was suspected to be in acute decompensated heart failure and the hospitalists were asked to admit.   Where does patient live?   At home  Can patient participate in ADLs?  Yes        Review of Systems:   General: no fevers, chills, sweats, poor appetite, or fatigue. Positive for recent  weight gain  HEENT: no blurry vision, hearing changes or sore throat Pulm: no productive cough,  positive for dyspnea on minimal exertion CV: no chest pain or palpitations, but positive for orthopnea and PND  Abd: no nausea, vomiting, abdominal pain, diarrhea, or constipation, positive for abdominal swelling  GU: no dysuria, hematuria, increased urinary frequency, or urgency  Ext: Positive for marked increase in her chronic lower extremity edema  Neuro: no focal weakness, numbness, or tingling, no vision change or hearing loss Skin: no rash, no wounds MSK: No muscle spasm, no deformity, no red, hot, or swollen joint Heme: No easy bruising or bleeding Travel history: No recent long distant travel    Allergy:  Allergies  Allergen Reactions  . Metformin And Related Diarrhea and Other (See Comments)    Stomach pains also    Past Medical History  Diagnosis Date  . Diabetes mellitus without complication (HCC)   . COPD (chronic obstructive pulmonary disease) (HCC)   . HTN (hypertension)     Past Surgical History  Procedure Laterality Date  . Cholecystectomy    . Cesarean section      Social History:  reports that she has never smoked. She does not have any smokeless tobacco history on file. She reports that she does not drink alcohol or use illicit drugs.  Family History: No family history on file.   Prior to Admission medications   Medication Sig Start Date End Date Taking? Authorizing Provider  albuterol (PROAIR HFA) 108 (90 BASE) MCG/ACT inhaler Inhale 2 puffs into the lungs every 6 (six) hours as needed for  wheezing or shortness of breath.   Yes Historical Provider, MD  amLODipine (NORVASC) 10 MG tablet Take 10 mg by mouth at bedtime.    Yes Historical Provider, MD  aspirin EC 81 MG tablet Take 81 mg by mouth at bedtime.    Yes Historical Provider, MD  budesonide-formoterol (SYMBICORT) 160-4.5 MCG/ACT inhaler Inhale 2 puffs into the lungs 2 (two) times daily.   Yes Historical  Provider, MD  Cholecalciferol (VITAMIN D) 2000 UNITS CAPS Take 2,000 Units by mouth daily.   Yes Historical Provider, MD  clobetasol (TEMOVATE) 0.05 % GEL Apply 1 application topically 2 (two) times daily. 10/19/15  Yes Historical Provider, MD  fexofenadine (ALLEGRA) 180 MG tablet Take 180 mg by mouth daily as needed for allergies or rhinitis.   Yes Historical Provider, MD  fluticasone (FLONASE) 50 MCG/ACT nasal spray Place 1 spray into both nostrils 2 (two) times daily.   Yes Historical Provider, MD  ibuprofen (ADVIL,MOTRIN) 200 MG tablet Take 800 mg by mouth every 6 (six) hours as needed for fever or moderate pain.    Yes Historical Provider, MD  insulin aspart (NOVOLOG FLEXPEN) 100 UNIT/ML FlexPen Inject 20 Units into the skin 3 (three) times daily with meals.    Yes Historical Provider, MD  insulin glargine (LANTUS) 100 unit/mL SOPN Inject 40 Units into the skin 2 (two) times daily.   Yes Historical Provider, MD  losartan (COZAAR) 100 MG tablet Take 100 mg by mouth daily.   Yes Historical Provider, MD  traMADol (ULTRAM) 50 MG tablet Take 50 mg by mouth every 12 (twelve) hours as needed for moderate pain.   Yes Historical Provider, MD  trolamine salicylate (ASPERCREME) 10 % cream Apply 1 application topically at bedtime.   Yes Historical Provider, MD  levofloxacin (LEVAQUIN) 750 MG tablet Take 1 tablet (750 mg total) by mouth daily. 08/04/15   Edsel PetrinMaryann Mikhail, DO    Physical Exam: Filed Vitals:   11/06/15 2130 11/06/15 2145 11/06/15 2208 11/06/15 2215  BP: 163/68 159/61 163/65 150/65  Pulse: 76 75 84 80  Temp:      TempSrc:      Resp:  18 20 20   Height:      Weight:      SpO2: 99% 96% 97% 95%   General: Not in acute respiratory distress, Obese HEENT:       Eyes: PERRL, EOMI, no scleral icterus or conjunctival pallor.       ENT: No discharge from the ears or nose, no pharyngeal ulcers, petechiae or exudate, no tonsillar enlargement.        Neck: No JVD, no bruit, no appreciable  mass Heme: No cervical adenopathy, no pallor Cardiac: S1/S2, RRR, Soft midsystolic murmur, No gallops or rubs. Pulm: Good air movement bilaterally. No rales, wheezing, rhonchi or rubs. Abd: Soft,  mild distention and pitting edema of the abdomen, nontender, no rebound pain or gaurding, BS present. Ext: Massively edematous lower extremities bilaterally up to the abdomen. 2+DP/PT pulse bilaterally. Musculoskeletal: No gross deformity, no red, hot, swollen joints  Skin: No rashes or wounds on exposed surfaces  Neuro: Alert, oriented X3, cranial nerves II-XII grossly intact. No focal findings Psych: Patient is not overtly psychotic.  Labs on Admission:  Basic Metabolic Panel:  Recent Labs Lab 11/06/15 2005  NA 141  K 3.8  CL 106  CO2 26  GLUCOSE 131*  BUN 10  CREATININE 0.82  CALCIUM 9.3   Liver Function Tests:  Recent Labs Lab 11/06/15 2005  AST 31  ALT 29  ALKPHOS 88  BILITOT 0.7  PROT 7.3  ALBUMIN 3.4*   No results for input(s): LIPASE, AMYLASE in the last 168 hours. No results for input(s): AMMONIA in the last 168 hours. CBC:  Recent Labs Lab 11/06/15 2005  WBC 7.6  NEUTROABS 5.4  HGB 13.5  HCT 43.5  MCV 87.3  PLT 249   Cardiac Enzymes: No results for input(s): CKTOTAL, CKMB, CKMBINDEX, TROPONINI in the last 168 hours.  BNP (last 3 results)  Recent Labs  08/02/15 0203 11/06/15 2006  BNP 36.9 35.0    ProBNP (last 3 results) No results for input(s): PROBNP in the last 8760 hours.  CBG: No results for input(s): GLUCAP in the last 168 hours.  Radiological Exams on Admission: Dg Chest 2 View  11/06/2015  CLINICAL DATA:  Shortness of breath. EXAM: CHEST  2 VIEW COMPARISON:  August 02, 2015. FINDINGS: Stable cardiomediastinal silhouette. Central pulmonary vascular congestion is noted with bilateral perihilar and basilar interstitial densities concerning for edema. No pneumothorax or pleural effusion is noted. Bony thorax is intact. IMPRESSION:  Central pulmonary vascular congestion and increased bilateral perihilar and basilar densities are noted concerning for pulmonary edema. Electronically Signed   By: Lupita Raider, M.D.   On: 11/06/2015 19:41    EKG: Independently reviewed.  Abnormal findings:  Sinus, RBBB, no change from prior   Assessment/Plan  1.  Acute on chronic diastolic CHF - TTE (08/03/2015), EF 60-65%, mild concentric LVH, grade 1 diastolic dysfunction - Progressive exertional dyspnea, orthopnea, wt gain, and peripheral edema noted by pt  - BNP not elevated, but CXR and exam (and history) c/w AoC CHF  - Strict I/O, daily wts, fluid restrictions  - Diurese with IV Lasix BID, renal function adequate - K-Dur BID, daily chem panel  - Imdur-HLZ, start low-dose  - Continue home ARB but lower dose given diuresis and new meds, hold home Norvasc  - Consider adding aldosterone antagonist next if tolerated  - Start beta-blocker once acute failure resolved - Consider repeat TTE this admit  - Manage DM, HTN, weight as below  2. Insulin-dependent diabetes mellitus - A1c 8.7% (08/02/2015), will repeat  - Hold home agents while in-house  - Give basal insulin, start lower than home dose given current diet restrictions - CBG qAC, qHS and prn suspected hypoglycemia  - Correctional per sliding-scale   3. Hypertension - Optimize medications for CHF as above - Hold home Norvasc while initiating new meds with CHF survival benefit  - cont home ARB, start Lasix, spironolactone, vasodilator, coreg once compensating  - Follow lytes closely  - Titrate dosing as appropriate  4. BMI 64 - Encourage wt loss - Pt has exercise bike, but unable to use in recent mos d/t dyspnea with minimal exertion - Pt getting a "glider" machine from daughter for Christmas, seems serious about increase aerobic activity as possible  - Briefly discussed some dietary changes   DVT ppx: SQ Heparin     Code Status: Full code Family Communication:  Yes,  patient's daughter at bed side Disposition Plan: Admit to inpatient   Date of Service 11/06/2015    Briscoe Deutscher, MD [facility] Pager [phone]  If 7PM-7AM, please contact night-coverage [email or web address] Password [email or web address] 11/06/2015, 11:12 PM

## 2015-11-06 NOTE — ED Provider Notes (Signed)
CSN: [id number]     Arrival date & time 11/06/15  1846 History   First MD Initiated Contact with Patient 11/06/15 1855     Chief Complaint  Patient presents with  . Shortness of Breath     (Consider location/radiation/quality/duration/timing/severity/associated sxs/prior Treatment) HPI Comments: Patient is a [age] rolled female with a history of morbid obesity, lung disease on Symbicort and pro-air, diabetes, hypertension and recurrent pneumonia who presents today with persistent worsening shortness of breath. Patient was admitted in September and at that time was found to have pneumonia. She has had difficulty clearing the pneumonia. She saw pulmonology at [facility] in November and at that time was placed on Avelox for bronchitis type symptoms. She had PFTs which showed she did not have COPD however they were abnormal. She has not followed up since that time but has continued to use Symbicort and pro-air. She is not currently wearing oxygen at home and states she wheezes and has shortness of breath regularly which is somewhat alleviated by the pro-air. There is a child living in her home that got sick several weeks ago and within the last week she's had it a have nasal congestion and worsening cough. She denies fever. She has had a flu shot this year. She denies abdominal pain, nausea or vomiting.  She states she has chronic swelling in her lower legs but is unclear she's had any weight gain.  Patient is a 64 y.o. female presenting with shortness of breath. The history is provided by the patient.  Shortness of Breath Severity:  Moderate Onset quality:  Gradual Timing:  Constant Progression:  Worsening Chronicity:  Chronic Context: URI   Context comment:  Over the last week she has started to get nasal congestion because there is a 66-year-old who is sick in the home Relieved by:  Nothing Worsened by:  Activity Ineffective treatments:  Inhaler Associated symptoms: cough and wheezing    Associated symptoms: no abdominal pain, no chest pain, no fever, no sore throat, no sputum production and no vomiting   Risk factors: obesity   Risk factors: no hx of PE/DVT, no prolonged immobilization and no tobacco use     Past Medical History  Diagnosis Date  . Diabetes mellitus without complication (HCC)   . COPD (chronic obstructive pulmonary disease) (HCC)   . HTN (hypertension)    Past Surgical History  Procedure Laterality Date  . Cholecystectomy    . Cesarean section     No family history on file. Social History  Substance Use Topics  . Smoking status: Never Smoker   . Smokeless tobacco: None  . Alcohol Use: No   OB History    No data available     Review of Systems  Constitutional: Negative for fever.  HENT: Negative for sore throat.   Respiratory: Positive for cough, shortness of breath and wheezing. Negative for sputum production.   Cardiovascular: Negative for chest pain.  Gastrointestinal: Negative for vomiting and abdominal pain.  All other systems reviewed and are negative.     Allergies  Review of patient's allergies indicates no known allergies.  Home Medications   Prior to Admission medications   Medication Sig Start Date End Date Taking? Authorizing Provider  albuterol (PROAIR HFA) 108 (90 BASE) MCG/ACT inhaler Inhale 2 puffs into the lungs every 6 (six) hours as needed for wheezing or shortness of breath.    Historical Provider, MD  amLODipine (NORVASC) 10 MG tablet Take 10 mg by mouth daily.  Historical Provider, MD  aspirin EC 81 MG tablet Take 81 mg by mouth daily.    Historical Provider, MD  budesonide-formoterol (SYMBICORT) 160-4.5 MCG/ACT inhaler Inhale 2 puffs into the lungs 2 (two) times daily.    Historical Provider, MD  ibuprofen (ADVIL,MOTRIN) 200 MG tablet Take 600 mg by mouth every 6 (six) hours as needed for fever or moderate pain.    Historical Provider, MD  insulin aspart (NOVOLOG FLEXPEN) 100 UNIT/ML FlexPen Inject 20 Units  into the skin 3 (three) times daily as needed (when eating a meal).    Historical Provider, MD  insulin glargine (LANTUS) 100 unit/mL SOPN Inject 40 Units into the skin 2 (two) times daily.    Historical Provider, MD  levofloxacin (LEVAQUIN) 750 MG tablet Take 1 tablet (750 mg total) by mouth daily. 08/04/15   Maryann Mikhail, DO  losartan (COZAAR) 100 MG tablet Take 100 mg by mouth daily.    Historical Provider, MD  metFORMIN (GLUCOPHAGE) 500 MG tablet Take 1,000 mg by mouth 2 (two) times daily with a meal.    Historical Provider, MD  trolamine salicylate (ASPERCREME) 10 % cream Apply 1 application topically 2 (two) times daily as needed for muscle pain.    Historical Provider, MD   BP 166/71 mmHg  Pulse 85  Temp(Src) 97.9 F (36.6 C) (Oral)  Resp 22  SpO2 96% Physical Exam  Constitutional: She is oriented to person, place, and time. She appears well-developed and well-nourished. No distress.  Morbid obesity  HENT:  Head: Normocephalic and atraumatic.  Mouth/Throat: Oropharynx is clear and moist.  Eyes: Conjunctivae and EOM are normal. Pupils are equal, round, and reactive to light.  Neck: Normal range of motion. Neck supple.  Cardiovascular: Normal rate, regular rhythm and intact distal pulses.   Murmur heard. Pulmonary/Chest: Effort normal and breath sounds normal. Tachypnea noted. No respiratory distress. She has no wheezes. She has no rales.  Decreased breath sounds throughout without distinct wheezing  Abdominal: Soft. She exhibits no distension. There is no tenderness. There is no rebound and no guarding.  Pitting edema in the abdomen.  Musculoskeletal: Normal range of motion. She exhibits edema. She exhibits no tenderness.  Pitting edema bilaterally.  Neurological: She is alert and oriented to person, place, and time.  Skin: Skin is warm and dry. No rash noted. No erythema.  Psychiatric: She has a normal mood and affect. Her behavior is normal.  Nursing note and vitals  reviewed.   ED Course  Procedures (including critical care time) Labs Review Labs Reviewed  CBC WITH DIFFERENTIAL/PLATELET - Abnormal; Notable for the following:    RDW 16.0 (*)    All other components within normal limits  COMPREHENSIVE METABOLIC PANEL - Abnormal; Notable for the following:    Glucose, Bld 131 (*)    Albumin 3.4 (*)    All other components within normal limits  BRAIN NATRIURETIC PEPTIDE  I-STAT TROPOININ, ED    Imaging Review Dg Chest 2 View  11/06/2015  CLINICAL DATA:  Shortness of breath. EXAM: CHEST  2 VIEW COMPARISON:  August 02, 2015. FINDINGS: Stable cardiomediastinal silhouette. Central pulmonary vascular congestion is noted with bilateral perihilar and basilar interstitial densities concerning for edema. No pneumothorax or pleural effusion is noted. Bony thorax is intact. IMPRESSION: Central pulmonary vascular congestion and increased bilateral perihilar and basilar densities are noted concerning for pulmonary edema. Electronically Signed   By: Lupita Raider, M.D.   On: 11/06/2015 19:41   I have personally reviewed and evaluated  these images and lab results as part of my medical decision-making.   EKG Interpretation   Date/Time:  Sunday November 06 2015 19:01:16 EST Ventricular Rate:  80 PR Interval:  149 QRS Duration: 137 QT Interval:  417 QTC Calculation: 481 R Axis:   118 Text Interpretation:  Sinus rhythm Right bundle branch block Baseline  wander in lead(s) II III aVF No significant change since last tracing  Confirmed by Anitra Lauth  MD, Alphonzo Lemmings ([id number]) on 11/06/2015 7:04:28 PM      MDM   Final diagnoses:  None    Patient is a 16 rolled female with a history of lung disease currently on Symbicort who had pneumonia in September and states since that time is never really gotten better but over the last week has had worsening shortness of breath then baseline. She has been using pro-air with minimal improvement. She denies any chest pain,  unilateral leg pain or swelling however she is fairly inactive most of the day. No prior history of CHF or PE.  She was told she may need home oxygen but has not used any up until this point. She is unclear if she's had any weight gain.  No abdominal pain, nausea, vomiting, fever or productive cough.  Today on exam patient is noted to have a murmur with peripheral edema. Concern for anemia, CHF, COPD exacerbation, pneumonia.  EKG shows a sinus rhythm with a right bundle branch block which is unchanged. Chest x-ray with concern for fluid overload.  CBC, BNP, troponin, CMP pending.  9:16 PM Labs are all within normal limits however chest x-ray shows fluid overload. Feel most likely BNP is falsely negative due to patient's morbid obesity. When relaying her here she is approximately 17 pounds from September. Low suspicion that this is pneumonia today and feel that patient needs be treated for pulmonary edema. She had an echo done in September that showed diastolic dysfunction without other acute findings.  Patient given IV Lasix and will admit for further care.  Gwyneth Sprout, MD 11/06/15 2117

## 2015-11-06 NOTE — ED Notes (Signed)
Per pt, pt c/o SOB x2 days, hx of pneumonia. Audible wheezing noted. Pt 92% on RA, placed on 2L [place]. Pt denies pain at this time.

## 2015-11-07 ENCOUNTER — Encounter (HOSPITAL_COMMUNITY): Payer: Self-pay

## 2015-11-07 ENCOUNTER — Other Ambulatory Visit (HOSPITAL_COMMUNITY): Payer: Medicare Other

## 2015-11-07 LAB — CBC
HCT: 43.6 % (ref 36.0–46.0)
HEMOGLOBIN: 13.3 g/dL (ref 12.0–15.0)
MCH: 26.7 pg (ref 26.0–34.0)
MCHC: 30.5 g/dL (ref 30.0–36.0)
MCV: 87.6 fL (ref 78.0–100.0)
PLATELETS: 239 10*3/uL (ref 150–400)
RBC: 4.98 MIL/uL (ref 3.87–5.11)
RDW: 16 % — ABNORMAL HIGH (ref 11.5–15.5)
WBC: 7.7 10*3/uL (ref 4.0–10.5)

## 2015-11-07 LAB — BASIC METABOLIC PANEL
ANION GAP: 8 (ref 5–15)
BUN: 13 mg/dL (ref 6–20)
CHLORIDE: 102 mmol/L (ref 101–111)
CO2: 29 mmol/L (ref 22–32)
Calcium: 8.9 mg/dL (ref 8.9–10.3)
Creatinine, Ser: 0.79 mg/dL (ref 0.44–1.00)
GFR calc Af Amer: >60 mL/min (ref >60)
GFR calc non Af Amer: >60 mL/min (ref >60)
GLUCOSE: 225 mg/dL — AB (ref 65–99)
POTASSIUM: 4 mmol/L (ref 3.5–5.1)
Sodium: 139 mmol/L (ref 135–145)

## 2015-11-07 LAB — CREATININE, SERUM
CREATININE: 0.8 mg/dL (ref 0.44–1.00)
GFR calc Af Amer: >60 mL/min (ref >60)

## 2015-11-07 LAB — TSH: TSH: 2.006 u[IU]/mL (ref 0.350–4.500)

## 2015-11-07 LAB — GLUCOSE, CAPILLARY
GLUCOSE-CAPILLARY: 216 mg/dL — AB (ref 65–99)
GLUCOSE-CAPILLARY: 237 mg/dL — AB (ref 65–99)
GLUCOSE-CAPILLARY: 222 mg/dL — AB (ref 65–99)
Glucose-Capillary: 249 mg/dL — ABNORMAL HIGH (ref 65–99)

## 2015-11-07 LAB — MAGNESIUM: MAGNESIUM: 1.7 mg/dL (ref 1.7–2.4)

## 2015-11-07 LAB — APTT: APTT: 28 s (ref 24–37)

## 2015-11-07 LAB — PROTIME-INR
INR: 1.18 (ref 0.00–1.49)
Prothrombin Time: 15.2 seconds (ref 11.6–15.2)

## 2015-11-07 MED ORDER — AMLODIPINE BESYLATE 10 MG PO TABS
10.0000 mg | ORAL_TABLET | Freq: Every day | ORAL | Status: DC
  Administered 2015-11-07 – 2015-11-09 (×3): 10 mg via ORAL
  Filled 2015-11-07 (×3): qty 1

## 2015-11-07 NOTE — Progress Notes (Signed)
Inpatient Diabetes Program Recommendations  AACE/ADA: New Consensus Statement on Inpatient Glycemic Control (2015)  Target Ranges:  Prepandial:   less than 140 mg/dL      Peak postprandial:   less than 180 mg/dL (1-2 hours)      Critically ill patients:  140 - 180 mg/dL   Review of Glycemic Control:  Results for Duwaine MaxinROGERS, Lux (MRN [id number]) as of 11/07/2015 13:58  Ref. Range 11/06/2015 23:33 11/07/2015 06:17 11/07/2015 11:38  Glucose-Capillary Latest Ref Range: 65-99 mg/dL 409131 (H) 811216 (H) 914237 (H)   Diabetes history: Type 2 diabetes Outpatient Diabetes medications: Lantus 40 units bid, Novolog 20 units tid with meals Current orders for Inpatient glycemic control:  Lantus 30 units q HS, Novolog resistant tid with meals  Inpatient Diabetes Program Recommendations:    May consider increasing Lantus to 25 units bid and add Novolog meal coverage 10 units tid with meals (Hold if patient eats less than 50%).  Thanks, Beryl MeagerJenny Adriana Quinby, RN, BC-ADM Inpatient Diabetes Coordinator Pager [phone] (8a-5p)

## 2015-11-07 NOTE — Progress Notes (Signed)
Pt up walking around the room and her monitor has artifact. I asked her to stay in the bed or chair but she is still walking around the room.

## 2015-11-07 NOTE — Progress Notes (Signed)
Utilization review completed. Geneen Dieter, RN, BSN. 

## 2015-11-07 NOTE — Progress Notes (Signed)
Patient admitted after midnight. Chart reviewed. Patient examined. Diuresing well. Just had echocardiogram 2 months ago. No need to repeat. Continue IV Lasix 40 mg twice daily.  Crista Curborinna Neilson Oehlert, MD [facility]

## 2015-11-08 DIAGNOSIS — Z794 Long term (current) use of insulin: Secondary | ICD-10-CM

## 2015-11-08 DIAGNOSIS — I5033 Acute on chronic diastolic (congestive) heart failure: Secondary | ICD-10-CM

## 2015-11-08 DIAGNOSIS — I5031 Acute diastolic (congestive) heart failure: Secondary | ICD-10-CM

## 2015-11-08 DIAGNOSIS — I10 Essential (primary) hypertension: Secondary | ICD-10-CM

## 2015-11-08 DIAGNOSIS — E119 Type 2 diabetes mellitus without complications: Secondary | ICD-10-CM

## 2015-11-08 DIAGNOSIS — Z6841 Body Mass Index (BMI) 40.0 and over, adult: Secondary | ICD-10-CM

## 2015-11-08 LAB — GLUCOSE, CAPILLARY
GLUCOSE-CAPILLARY: 328 mg/dL — AB (ref 65–99)
GLUCOSE-CAPILLARY: 211 mg/dL — AB (ref 65–99)
Glucose-Capillary: 156 mg/dL — ABNORMAL HIGH (ref 65–99)
Glucose-Capillary: 190 mg/dL — ABNORMAL HIGH (ref 65–99)

## 2015-11-08 LAB — BASIC METABOLIC PANEL
ANION GAP: 7 (ref 5–15)
BUN: 14 mg/dL (ref 6–20)
CALCIUM: 8.8 mg/dL — AB (ref 8.9–10.3)
CHLORIDE: 97 mmol/L — AB (ref 101–111)
CO2: 32 mmol/L (ref 22–32)
Creatinine, Ser: 0.8 mg/dL (ref 0.44–1.00)
GFR calc Af Amer: >60 mL/min (ref >60)
GFR calc non Af Amer: >60 mL/min (ref >60)
GLUCOSE: 175 mg/dL — AB (ref 65–99)
POTASSIUM: 3.9 mmol/L (ref 3.5–5.1)
Sodium: 136 mmol/L (ref 135–145)

## 2015-11-08 LAB — HEMOGLOBIN A1C
HEMOGLOBIN A1C: 8.3 % — AB (ref 4.8–5.6)
Mean Plasma Glucose: 192 mg/dL

## 2015-11-08 MED ORDER — INSULIN GLARGINE 100 UNIT/ML ~~LOC~~ SOLN
25.0000 [IU] | Freq: Two times a day (BID) | SUBCUTANEOUS | Status: DC
  Administered 2015-11-09 – 2015-11-10 (×4): 25 [IU] via SUBCUTANEOUS
  Filled 2015-11-08 (×6): qty 0.25

## 2015-11-08 NOTE — Progress Notes (Signed)
[facility]                                                                              Patient Demographics  Dorothy Hammond, is a 64 y.o. female, DOB - October 03, 1951, WUJ:[id number]  Admit date - 11/06/2015   Admitting Physician Briscoe Deutscher, MD  Outpatient Primary MD for the patient is CAMPOS,CLAUDIA, MD  LOS - 2   Chief Complaint  Patient presents with  . Shortness of Breath      HPI on 11/06/2015 by Dr. Marcial Pacas Opyd Dorothy Hammond is a 64 y.o. female with PMH of hypertension, diabetes, and BMI 64 who presents to the ED with worsening dyspnea, orthopnea, and lower extremity edema. Patient was admitted for pneumonia in September this year and states that since time of discharge, she is never made a full recovery from a respiratory standpoint. She was seen by pulmonologist at [facility] in November this year where she had spirometry that was reportedly abnormal but negative for COPD. She's been taking Symbicort and albuterol for the last 3 months, but despite this her dyspnea is continuing to worsen. Over this interval, she is also developed worsening lower extremity edema and orthopnea. She reports gaining close to 20 pounds in the last 3 months. Symptoms have become so bad over the last few days that she's been essentially immobile, prompting her visit to the ED. She denies fevers, chills, chest pains, palpitations. She denies cough or sick contacts. There is been no recent travel and no unilateral leg pain or swelling.   In ED, patient was found to be afebrile and didn't appear in discomfort, but no acute respiratory distress. Chem panel and CBC were unremarkable, troponin was undetectable, and EKG demonstrated a sinus rhythm with chronic right bundle branch block that was unchanged from priors. A 2 view chest x-ray was obtained which demonstrated central pulmonary vascular congestion with pulmonary edema. She was suspected to be in acute decompensated heart failure  and the hospitalists were asked to admit.   Assessment & Plan   Acute on chronic diastolic heart failure -Echocardiogram 08/03/2015 showed an EF of 60-65%, mild concentric LVH, grade 1 diastolic dysfunction -Upon admission, patient did have progressive exertional dyspnea with orthopnea -BNP 35 upon admission -Continue to monitor daily weights, intake and output -Continue diuresis with IV Lasix -Patient had 3650 mL of urine output over the last 24 hours  Diabetes mellitus, type II -Continue insulin sliding scale CBG monitoring -Diabetes coordinator following -Will change Lantus to 25 units twice a day  Essential hypertension -Continue amlodipine, Lasix, losartan  Morbid obesity -BMI 64 -Patient will need to discuss lifestyle modifications with her primary care physician upon discharge  Code Status: Full  Family Communication: None at bedside  Disposition Plan: Admitted. Continue to monitor  Time Spent in minutes   30 minutes  Procedures  None  Consults   None  DVT Prophylaxis  Lovenox  Lab Results  Component Value Date   PLT 239 11/07/2015    Medications  Scheduled Meds: . amLODipine  10 mg Oral QHS  . aspirin EC  81 mg Oral QHS  . budesonide-formoterol  2 puff Inhalation BID  .  cholecalciferol  2,000 Units Oral Daily  . clobetasol cream  1 application Topical BID  . enoxaparin (LOVENOX) injection  40 mg Subcutaneous Q24H  . fluticasone  1 spray Each Nare BID  . furosemide  40 mg Intravenous BID  . insulin aspart  0-20 Units Subcutaneous TID WC  . insulin glargine  30 Units Subcutaneous QHS  . losartan  12.5 mg Oral Daily  . MUSCLE RUB  1 application Topical QHS  . potassium chloride  20 mEq Oral BID  . sodium chloride  3 mL Intravenous Q12H   Continuous Infusions:  PRN Meds:.sodium chloride, acetaminophen, albuterol, ondansetron (ZOFRAN) IV, sodium chloride, traMADol  Antibiotics    Anti-infectives    None     Subjective:   Dorothy Hammond seen and  examined today.  Patient states her breathing has improved. Denies any chest pain, abdominal pain, changes in bowel pattern, dizziness or headache.  Objective:   Filed Vitals:   11/08/15 0451 11/08/15 0950 11/08/15 1012 11/08/15 1138  BP: 125/52  144/56 151/51  Pulse: 65  72 77  Temp: 98 F (36.7 C)   97.9 F (36.6 C)  TempSrc: Oral   Oral  Resp: 18   16  Height:      Weight: 120.566 kg (265 lb 12.8 oz)     SpO2: 98% 96%  91%    Wt Readings from Last 3 Encounters:  11/08/15 120.566 kg (265 lb 12.8 oz)  08/02/15 117.8 kg (259 lb 11.2 oz)     Intake/Output Summary (Last 24 hours) at 11/08/15 1315 Last data filed at 11/08/15 1013  Gross per 24 hour  Intake    820 ml  Output   3500 ml  Net  -2680 ml    Exam  General: Well developed, well nourished, NAD, appears stated age  HEENT: NCAT,  mucous membranes moist.   Cardiovascular: S1 S2 auscultated, soft murmur, Regular rate and rhythm.  Respiratory: Clear to auscultation bilaterally with equal chest rise  Abdomen: Soft, obese, nontender, nondistended, + bowel sounds  Extremities: warm dry without cyanosis clubbing. TTP of the LE B/L. LE edema.  Neuro: AAOx3, nonfocal  Psych: Normal affect and demeanor  Data Review   Micro Results No results found for this or any previous visit (from the past 240 hour(s)).  Radiology Reports Dg Chest 2 View  11/06/2015  CLINICAL DATA:  Shortness of breath. EXAM: CHEST  2 VIEW COMPARISON:  August 02, 2015. FINDINGS: Stable cardiomediastinal silhouette. Central pulmonary vascular congestion is noted with bilateral perihilar and basilar interstitial densities concerning for edema. No pneumothorax or pleural effusion is noted. Bony thorax is intact. IMPRESSION: Central pulmonary vascular congestion and increased bilateral perihilar and basilar densities are noted concerning for pulmonary edema. Electronically Signed   By: Lupita RaiderJames  Green Jr, M.D.   On: 11/06/2015 19:41     CBC  Recent Labs Lab 11/06/15 2005 11/07/15 0011  WBC 7.6 7.7  HGB 13.5 13.3  HCT 43.5 43.6  PLT 249 239  MCV 87.3 87.6  MCH 27.1 26.7  MCHC 31.0 30.5  RDW 16.0* 16.0*  LYMPHSABS 1.4  --   MONOABS 0.7  --   EOSABS 0.2  --   BASOSABS 0.0  --     Chemistries   Recent Labs Lab 11/06/15 2005 11/07/15 0011 11/07/15 0629 11/08/15 0356  NA 141  --  139 136  K 3.8  --  4.0 3.9  CL 106  --  102 97*  CO2 26  --  29 32  GLUCOSE 131*  --  225* 175*  BUN 10  --  13 14  CREATININE 0.82 0.80 0.79 0.80  CALCIUM 9.3  --  8.9 8.8*  MG  --  1.7  --   --   AST 31  --   --   --   ALT 29  --   --   --   ALKPHOS 88  --   --   --   BILITOT 0.7  --   --   --    ------------------------------------------------------------------------------------------------------------------ estimated creatinine clearance is 74.9 mL/min (by C-G formula based on Cr of 0.8). ------------------------------------------------------------------------------------------------------------------  Recent Labs  11/07/15 0011  HGBA1C 8.3*   ------------------------------------------------------------------------------------------------------------------ No results for input(s): CHOL, HDL, LDLCALC, TRIG, CHOLHDL, LDLDIRECT in the last 72 hours. ------------------------------------------------------------------------------------------------------------------  Recent Labs  11/07/15 0011  TSH 2.006   ------------------------------------------------------------------------------------------------------------------ No results for input(s): VITAMINB12, FOLATE, FERRITIN, TIBC, IRON, RETICCTPCT in the last 72 hours.  Coagulation profile  Recent Labs Lab 11/07/15 0011  INR 1.18    No results for input(s): DDIMER in the last 72 hours.  Cardiac Enzymes No results for input(s): CKMB, TROPONINI, MYOGLOBIN in the last 168 hours.  Invalid input(s):  CK ------------------------------------------------------------------------------------------------------------------ Invalid input(s): POCBNP    Dorothy Hammond D.O. on 11/08/2015 at 1:15 PM  Between 7am to 7pm - Pager - [phone]  After 7pm go to [email or web address] - password [email or web address]  And look for the night coverage person covering for me after hours  [facility] Office  [phone]

## 2015-11-08 NOTE — Progress Notes (Signed)
SATURATION QUALIFICATIONS: (This note is used to comply with regulatory documentation for home oxygen)  Patient Saturations on Room Air at Rest = 86%  Patient Saturations on Room Air while Ambulating = 81%  Patient Saturations on  2  Liters of oxygen while Ambulating = 96  Please briefly explain why patient needs home oxygen:  Pt 's sats decreased to 81% on RA while ambulating.

## 2015-11-09 LAB — BASIC METABOLIC PANEL
Anion gap: 8 (ref 5–15)
BUN: 12 mg/dL (ref 6–20)
CHLORIDE: 95 mmol/L — AB (ref 101–111)
CO2: 32 mmol/L (ref 22–32)
Calcium: 8.9 mg/dL (ref 8.9–10.3)
Creatinine, Ser: 0.76 mg/dL (ref 0.44–1.00)
GLUCOSE: 208 mg/dL — AB (ref 65–99)
Potassium: 3.9 mmol/L (ref 3.5–5.1)
SODIUM: 135 mmol/L (ref 135–145)

## 2015-11-09 LAB — GLUCOSE, CAPILLARY
GLUCOSE-CAPILLARY: 207 mg/dL — AB (ref 65–99)
Glucose-Capillary: 166 mg/dL — ABNORMAL HIGH (ref 65–99)
Glucose-Capillary: 173 mg/dL — ABNORMAL HIGH (ref 65–99)
Glucose-Capillary: 185 mg/dL — ABNORMAL HIGH (ref 65–99)

## 2015-11-09 NOTE — Care Management Important Message (Signed)
Important Message  Patient Details  Name: Dorothy Hammond MRN: [id number] Date of Birth: 09/13/1951   Medicare Important Message Given:  Yes    Dorothy Hammond 11/09/2015, 10:20 AM

## 2015-11-09 NOTE — Progress Notes (Signed)
SATURATION QUALIFICATIONS: (This note is used to comply with regulatory documentation for home oxygen)  Patient Saturations on Room Air at Rest = 88%  Patient Saturations on Room Air while Ambulating = 83%  Patient Saturations on 2 Liters of oxygen while at rest = 94%  Patient Saturations on 2 Liters of oxygen while ambulating = 88%  Please briefly explain why patient needs home oxygen:  Pt oxygen saturations decreased to 83% without oxygen, while ambulating.

## 2015-11-09 NOTE — Progress Notes (Signed)
[facility]                                                                              Patient Demographics  Dorothy Hammond, is a 64 y.o. female, DOB - 1951/07/05, ZOX:[id number]  Admit date - 11/06/2015   Admitting Physician Briscoe Deutscher, MD  Outpatient Primary MD for the patient is CAMPOS,CLAUDIA, MD  LOS - 3   Chief Complaint  Patient presents with  . Shortness of Breath      HPI on 11/06/2015 by Dr. Marcial Pacas Opyd Dorothy Hammond is a 64 y.o. female with PMH of hypertension, diabetes, and BMI 64 who presents to the ED with worsening dyspnea, orthopnea, and lower extremity edema. Patient was admitted for pneumonia in September this year and states that since time of discharge, she is never made a full recovery from a respiratory standpoint. She was seen by pulmonologist at [facility] in November this year where she had spirometry that was reportedly abnormal but negative for COPD. She's been taking Symbicort and albuterol for the last 3 months, but despite this her dyspnea is continuing to worsen. Over this interval, she is also developed worsening lower extremity edema and orthopnea. She reports gaining close to 20 pounds in the last 3 months. Symptoms have become so bad over the last few days that she's been essentially immobile, prompting her visit to the ED. She denies fevers, chills, chest pains, palpitations. She denies cough or sick contacts. There is been no recent travel and no unilateral leg pain or swelling.   In ED, patient was found to be afebrile and didn't appear in discomfort, but no acute respiratory distress. Chem panel and CBC were unremarkable, troponin was undetectable, and EKG demonstrated a sinus rhythm with chronic right bundle branch block that was unchanged from priors. A 2 view chest x-ray was obtained which demonstrated central pulmonary vascular congestion with pulmonary edema. She was suspected to be in acute decompensated heart failure  and the hospitalists were asked to admit.   Assessment & Plan   Acute on chronic diastolic heart failure -Echocardiogram 08/03/2015 showed an EF of 60-65%, mild concentric LVH, grade 1 diastolic dysfunction -Upon admission, patient did have progressive exertional dyspnea with orthopnea -BNP 35 upon admission -Continue to monitor daily weights, intake and output -Continue diuresis with IV Lasix -Patient had 4150 mL of urine output over the last 24 hours  Diabetes mellitus, type II -Continue insulin sliding scale CBG monitoring -Diabetes coordinator following -Continue Lantus to 25 units twice a day  Essential hypertension -Continue amlodipine, Lasix, losartan  Morbid obesity -BMI 64 -Patient will need to discuss lifestyle modifications with her primary care physician upon discharge  Code Status: Full  Family Communication: None at bedside  Disposition Plan: Admitted. Continue to monitor.  Patient improving slowly  Time Spent in minutes   30 minutes  Procedures  None  Consults   None  DVT Prophylaxis  Lovenox  Lab Results  Component Value Date   PLT 239 11/07/2015    Medications  Scheduled Meds: . amLODipine  10 mg Oral QHS  . aspirin EC  81 mg Oral QHS  . budesonide-formoterol  2 puff Inhalation  BID  . cholecalciferol  2,000 Units Oral Daily  . clobetasol cream  1 application Topical BID  . enoxaparin (LOVENOX) injection  40 mg Subcutaneous Q24H  . fluticasone  1 spray Each Nare BID  . furosemide  40 mg Intravenous BID  . insulin aspart  0-20 Units Subcutaneous TID WC  . insulin glargine  25 Units Subcutaneous BID  . losartan  12.5 mg Oral Daily  . MUSCLE RUB  1 application Topical QHS  . potassium chloride  20 mEq Oral BID  . sodium chloride  3 mL Intravenous Q12H   Continuous Infusions:  PRN Meds:.sodium chloride, acetaminophen, albuterol, ondansetron (ZOFRAN) IV, sodium chloride, traMADol  Antibiotics    Anti-infectives    None     Subjective:    Dorothy MaxinLuz Motter seen and examined today.  Patient states her breathing has improved but she continues to feel short of breath. Denies any chest pain, abdominal pain, changes in bowel pattern, dizziness or headache.  Objective:   Filed Vitals:   11/09/15 0551 11/09/15 0800 11/09/15 0852 11/09/15 1106  BP: 135/37 134/51  130/45  Pulse: 75 67  71  Temp: 98.4 F (36.9 C)   98 F (36.7 C)  TempSrc: Oral   Oral  Resp: 19   18  Height:      Weight: 118.978 kg (262 lb 4.8 oz)     SpO2: 94%  93% 90%    Wt Readings from Last 3 Encounters:  11/09/15 118.978 kg (262 lb 4.8 oz)  08/02/15 117.8 kg (259 lb 11.2 oz)     Intake/Output Summary (Last 24 hours) at 11/09/15 1332 Last data filed at 11/09/15 1108  Gross per 24 hour  Intake    635 ml  Output   3725 ml  Net  -3090 ml    Exam  General: Well developed, well nourished, NAD  HEENT: NCAT,  mucous membranes moist.   Cardiovascular: S1 S2 auscultated, soft murmur,RRR  Respiratory: Diminished but Clear to auscultation   Abdomen: Soft, obese, nontender, nondistended, + bowel sounds  Extremities: warm dry without cyanosis clubbing. TTP of the LE B/L. LE edema.  Neuro: AAOx3, nonfocal  Psych: Normal affect and demeanor, pleasant  Data Review   Micro Results No results found for this or any previous visit (from the past 240 hour(s)).  Radiology Reports Dg Chest 2 View  11/06/2015  CLINICAL DATA:  Shortness of breath. EXAM: CHEST  2 VIEW COMPARISON:  August 02, 2015. FINDINGS: Stable cardiomediastinal silhouette. Central pulmonary vascular congestion is noted with bilateral perihilar and basilar interstitial densities concerning for edema. No pneumothorax or pleural effusion is noted. Bony thorax is intact. IMPRESSION: Central pulmonary vascular congestion and increased bilateral perihilar and basilar densities are noted concerning for pulmonary edema. Electronically Signed   By: Lupita RaiderJames  Green Jr, M.D.   On: 11/06/2015 19:41     CBC  Recent Labs Lab 11/06/15 2005 11/07/15 0011  WBC 7.6 7.7  HGB 13.5 13.3  HCT 43.5 43.6  PLT 249 239  MCV 87.3 87.6  MCH 27.1 26.7  MCHC 31.0 30.5  RDW 16.0* 16.0*  LYMPHSABS 1.4  --   MONOABS 0.7  --   EOSABS 0.2  --   BASOSABS 0.0  --     Chemistries   Recent Labs Lab 11/06/15 2005 11/07/15 0011 11/07/15 0629 11/08/15 0356 11/09/15 0305  NA 141  --  139 136 135  K 3.8  --  4.0 3.9 3.9  CL 106  --  102  97* 95*  CO2 26  --  29 32 32  GLUCOSE 131*  --  225* 175* 208*  BUN 10  --  CREATININE 0.82 0.80 0.79 0.80 0.76  CALCIUM 9.3  --  8.9 8.8* 8.9  MG  --  1.7  --   --   --   AST 31  --   --   --   --   ALT 29  --   --   --   --   ALKPHOS 88  --   --   --   --   BILITOT 0.7  --   --   --   --    ------------------------------------------------------------------------------------------------------------------ estimated creatinine clearance is 74.3 mL/min (by C-G formula based on Cr of 0.76). ------------------------------------------------------------------------------------------------------------------  Recent Labs  11/07/15 0011  HGBA1C 8.3*   ------------------------------------------------------------------------------------------------------------------ No results for input(s): CHOL, HDL, LDLCALC, TRIG, CHOLHDL, LDLDIRECT in the last 72 hours. ------------------------------------------------------------------------------------------------------------------  Recent Labs  11/07/15 0011  TSH 2.006   ------------------------------------------------------------------------------------------------------------------ No results for input(s): VITAMINB12, FOLATE, FERRITIN, TIBC, IRON, RETICCTPCT in the last 72 hours.  Coagulation profile  Recent Labs Lab 11/07/15 0011  INR 1.18    No results for input(s): DDIMER in the last 72 hours.  Cardiac Enzymes No results for input(s): CKMB, TROPONINI, MYOGLOBIN in the last 168  hours.  Invalid input(s): CK ------------------------------------------------------------------------------------------------------------------ Invalid input(s): POCBNP    Markavious Micco D.O. on 11/09/2015 at 1:32 PM  Between 7am to 7pm - Pager - [phone]  After 7pm go to [email or web address] - password [email or web address]  And look for the night coverage person covering for me after hours  [facility] Office  [phone]

## 2015-11-10 DIAGNOSIS — R0981 Nasal congestion: Secondary | ICD-10-CM

## 2015-11-10 DIAGNOSIS — R0609 Other forms of dyspnea: Secondary | ICD-10-CM

## 2015-11-10 LAB — CBC
HCT: 41.9 % (ref 36.0–46.0)
Hemoglobin: 13.5 g/dL (ref 12.0–15.0)
MCH: 28.1 pg (ref 26.0–34.0)
MCHC: 32.2 g/dL (ref 30.0–36.0)
MCV: 87.3 fL (ref 78.0–100.0)
PLATELETS: 256 10*3/uL (ref 150–400)
RBC: 4.8 MIL/uL (ref 3.87–5.11)
RDW: 15 % (ref 11.5–15.5)
WBC: 8.5 10*3/uL (ref 4.0–10.5)

## 2015-11-10 LAB — BASIC METABOLIC PANEL
ANION GAP: 7 (ref 5–15)
BUN: 11 mg/dL (ref 6–20)
CALCIUM: 9.3 mg/dL (ref 8.9–10.3)
CO2: 34 mmol/L — ABNORMAL HIGH (ref 22–32)
Chloride: 95 mmol/L — ABNORMAL LOW (ref 101–111)
Creatinine, Ser: 0.79 mg/dL (ref 0.44–1.00)
GFR calc Af Amer: >60 mL/min (ref >60)
GLUCOSE: 198 mg/dL — AB (ref 65–99)
POTASSIUM: 4.5 mmol/L (ref 3.5–5.1)
SODIUM: 136 mmol/L (ref 135–145)

## 2015-11-10 LAB — GLUCOSE, CAPILLARY
Glucose-Capillary: 188 mg/dL — ABNORMAL HIGH (ref 65–99)
Glucose-Capillary: 227 mg/dL — ABNORMAL HIGH (ref 65–99)

## 2015-11-10 MED ORDER — PSEUDOEPHEDRINE HCL 30 MG PO TABS
30.0000 mg | ORAL_TABLET | Freq: Four times a day (QID) | ORAL | Status: DC | PRN
  Filled 2015-11-10: qty 1

## 2015-11-10 MED ORDER — FUROSEMIDE 40 MG PO TABS
40.0000 mg | ORAL_TABLET | Freq: Every day | ORAL | Status: AC
Start: 2015-11-10 — End: ?

## 2015-11-10 MED ORDER — INSULIN GLARGINE 100 UNITS/ML SOLOSTAR PEN: 40.0000 [IU] | PEN_INJECTOR | Freq: Two times a day (BID) | SUBCUTANEOUS | Status: AC

## 2015-11-10 MED ORDER — LOSARTAN POTASSIUM 25 MG PO TABS: 25.0000 mg | ORAL_TABLET | Freq: Every day | ORAL | Status: DC

## 2015-11-10 MED ORDER — PSEUDOEPHEDRINE HCL 30 MG PO TABS: 30.0000 mg | ORAL_TABLET | Freq: Four times a day (QID) | ORAL | Status: DC | PRN

## 2015-11-10 NOTE — Discharge Summary (Signed)
Physician Discharge Summary  Dorothy Hammond WUJ:[id number] DOB: Apr 29, 1951 DOA: 11/06/2015  PCP: Aldona Bar, MD  Admit date: 11/06/2015 Discharge date: 11/10/2015  Time spent: 45 minutes  Recommendations for Outpatient Follow-up:  Patient will be discharged to home with home oxygen.  Patient will also need a sleep study.  Patient will need to follow up with primary care provider within one week of discharge, repeat CBC and BMP.   Patient should follow up with Dr. Antoine Poche on 11/11/2015 at 2:30pm.  Patient should continue medications as prescribed.  Patient should follow a heart healthy/carb modified diet.   Discharge Diagnoses:  Acute on chronic diastolic heart failure Diabetes mellitus, type II Essential hypertension Morbid obesity Nasal congestion Dyspnea on exertion  Discharge Condition: Stable  Diet recommendation: Heart healthy/carb modified  Filed Weights   11/08/15 0451 11/09/15 0551 11/10/15 0440  Weight: 120.566 kg (265 lb 12.8 oz) 118.978 kg (262 lb 4.8 oz) 117.209 kg (258 lb 6.4 oz)    History of present illness:  on 11/06/2015 by Dr. Marcial Pacas Dorothy Hammond is a 64 y.o. female with PMH of hypertension, diabetes, and BMI 64 who presents to the ED with worsening dyspnea, orthopnea, and lower extremity edema. Patient was admitted for pneumonia in September this year and states that since time of discharge, she is never made a full recovery from a respiratory standpoint. She was seen by pulmonologist at [facility] in November this year where she had spirometry that was reportedly abnormal but negative for COPD. She's been taking Symbicort and albuterol for the last 3 months, but despite this her dyspnea is continuing to worsen. Over this interval, she is also developed worsening lower extremity edema and orthopnea. She reports gaining close to 20 pounds in the last 3 months. Symptoms have become so bad over the last few days that she's been essentially  immobile, prompting her visit to the ED. She denies fevers, chills, chest pains, palpitations. She denies cough or sick contacts. There is been no recent travel and no unilateral leg pain or swelling.   In ED, patient was found to be afebrile and didn't appear in discomfort, but no acute respiratory distress. Chem panel and CBC were unremarkable, troponin was undetectable, and EKG demonstrated a sinus rhythm with chronic right bundle branch block that was unchanged from priors. A 2 view chest x-ray was obtained which demonstrated central pulmonary vascular congestion with pulmonary edema. She was suspected to be in acute decompensated heart failure and the hospitalists were asked to admit.   Hospital Course:  Acute on chronic diastolic heart failure -Echocardiogram 08/03/2015 showed an EF of 60-65%, mild concentric LVH, grade 1 diastolic dysfunction -Upon admission, patient did have progressive exertional dyspnea with orthopnea -BNP 35 upon admission -Continue to monitor daily weights, intake and output -Patient diuresed well with IV Lasix -Weight down approximately 7 kg since admission -Patient had 3675 mL of urine output over the last 24 hours -Will discharge patient with Lasix 40 mg by mouth daily -Patient will need repeat BMP in 1 week -Patient will follow up with cardiology as an outpatient.  Diabetes mellitus, type II -Continue insulin sliding scale CBG monitoring -Diabetes coordinator following -Continue home regimen at dischage  Essential hypertension -Continue amlodipine, Lasix, losartan (decreased dose of losartan)  Morbid obesity -BMI 64 -Patient will need to discuss lifestyle modifications with her primary care physician upon discharge  Nasal congestion -Continue Flonase, Sudafed for a few days (as this can raise her BP)  Dyspnea on exertion -  Patient's oxygen saturations do drop while ambulating to 83%, patient will be discharged with oxygen while ambulating. -Patient  also need to have sleep study done as an outpatient.  Procedures  None  Consults  None  Discharge Exam: Filed Vitals:   11/09/15 1956 11/10/15 0440  BP: 148/55 127/43  Pulse: 88 74  Temp: 99.3 F (37.4 C) 98.6 F (37 C)  Resp: 18 21    Exam  General: Well developed, well nourished, NAD  HEENT: NCAT, mucous membranes moist.   Cardiovascular: S1 S2 auscultated, soft murmur,RRR  Respiratory: Diminished but Clear to auscultation (likely due to body habitus)  Abdomen: Soft, obese, nontender, nondistended, + bowel sounds  Extremities: warm dry without cyanosis clubbing, edema  Neuro: AAOx3, nonfocal  Psych: Normal affect and demeanor, pleasant  Discharge Instructions      Discharge Instructions    Amb Referral to HF Clinic    Complete by:  As directed      Discharge instructions    Complete by:  As directed   Patient will be discharged to home with home oxygen.  Patient will also need a sleep study.  Patient will need to follow up with primary care provider within one week of discharge, repeat CBC and BMP.  Patient should continue medications as prescribed.  Patient should follow a heart healthy/carb modified diet.            Medication List    STOP taking these medications        levofloxacin 750 MG tablet  Commonly known as:  LEVAQUIN      TAKE these medications        amLODipine 10 MG tablet  Commonly known as:  NORVASC  Take 10 mg by mouth at bedtime.     aspirin EC 81 MG tablet  Take 81 mg by mouth at bedtime.     budesonide-formoterol 160-4.5 MCG/ACT inhaler  Commonly known as:  SYMBICORT  Inhale 2 puffs into the lungs 2 (two) times daily.     clobetasol 0.05 % Gel  Commonly known as:  TEMOVATE  Apply 1 application topically 2 (two) times daily.     fexofenadine 180 MG tablet  Commonly known as:  ALLEGRA  Take 180 mg by mouth daily as needed for allergies or rhinitis.     fluticasone 50 MCG/ACT nasal spray  Commonly known as:   FLONASE  Place 1 spray into both nostrils 2 (two) times daily.     furosemide 40 MG tablet  Commonly known as:  LASIX  Take 1 tablet (40 mg total) by mouth daily.     ibuprofen 200 MG tablet  Commonly known as:  ADVIL,MOTRIN  Take 800 mg by mouth every 6 (six) hours as needed for fever or moderate pain.     insulin glargine 100 unit/mL Sopn  Commonly known as:  LANTUS  Inject 0.4 mLs (40 Units total) into the skin 2 (two) times daily.     losartan 25 MG tablet  Commonly known as:  COZAAR  Take 1 tablet (25 mg total) by mouth daily.     NOVOLOG FLEXPEN 100 UNIT/ML FlexPen  Generic drug:  insulin aspart  Inject 20 Units into the skin 3 (three) times daily with meals.     PROAIR HFA 108 (90 BASE) MCG/ACT inhaler  Generic drug:  albuterol  Inhale 2 puffs into the lungs every 6 (six) hours as needed for wheezing or shortness of breath.     pseudoephedrine 30 MG tablet  Commonly known as:  SUDAFED  Take 1 tablet (30 mg total) by mouth every 6 (six) hours as needed for congestion.     traMADol 50 MG tablet  Commonly known as:  ULTRAM  Take 50 mg by mouth every 12 (twelve) hours as needed for moderate pain.     trolamine salicylate 10 % cream  Commonly known as:  ASPERCREME  Apply 1 application topically at bedtime.     Vitamin D 2000 UNITS Caps  Take 2,000 Units by mouth daily.       Allergies  Allergen Reactions  . Metformin And Related Diarrhea and Other (See Comments)    Stomach pains also   Follow-up Information    Follow up with CAMPOS,CLAUDIA, MD On 11/14/2015.   Why:  @ 9;40am   Contact information:   [address] [place] [address] [phone]       Follow up with Janetta Hora, PA-C.   Specialties:  Cardiology, Radiology   Contact information:   [address] [place] [address] [phone]        The results of significant diagnostics from this hospitalization (including imaging, microbiology, ancillary and  laboratory) are listed below for reference.    Significant Diagnostic Studies: Dg Chest 2 View  11/06/2015  CLINICAL DATA:  Shortness of breath. EXAM: CHEST  2 VIEW COMPARISON:  August 02, 2015. FINDINGS: Stable cardiomediastinal silhouette. Central pulmonary vascular congestion is noted with bilateral perihilar and basilar interstitial densities concerning for edema. No pneumothorax or pleural effusion is noted. Bony thorax is intact. IMPRESSION: Central pulmonary vascular congestion and increased bilateral perihilar and basilar densities are noted concerning for pulmonary edema. Electronically Signed   By: Lupita Raider, M.D.   On: 11/06/2015 19:41    Microbiology: No results found for this or any previous visit (from the past 240 hour(s)).   Labs: Basic Metabolic Panel:  Recent Labs Lab 11/06/15 2005 11/07/15 0011 11/07/15 0629 11/08/15 0356 11/09/15 0305 11/10/15 0605  NA 141  --  139 136 135 136  K 3.8  --  4.0 3.9 3.9 4.5  CL 106  --  102 97* 95* 95*  CO2 26  --  29 32 32 34*  GLUCOSE 131*  --  225* 175* 208* 198*  BUN 10  --  CREATININE 0.82 0.80 0.79 0.80 0.76 0.79  CALCIUM 9.3  --  8.9 8.8* 8.9 9.3  MG  --  1.7  --   --   --   --    Liver Function Tests:  Recent Labs Lab 11/06/15 2005  AST 31  ALT 29  ALKPHOS 88  BILITOT 0.7  PROT 7.3  ALBUMIN 3.4*   No results for input(s): LIPASE, AMYLASE in the last 168 hours. No results for input(s): AMMONIA in the last 168 hours. CBC:  Recent Labs Lab 11/06/15 2005 11/07/15 0011 11/10/15 0605  WBC 7.6 7.7 8.5  NEUTROABS 5.4  --   --   HGB 13.5 13.3 13.5  HCT 43.5 43.6 41.9  MCV 87.3 87.6 87.3  PLT 249 239 256   Cardiac Enzymes: No results for input(s): CKTOTAL, CKMB, CKMBINDEX, TROPONINI in the last 168 hours. BNP: BNP (last 3 results)  Recent Labs  08/02/15 0203 11/06/15 2006  BNP 36.9 35.0    ProBNP (last 3 results) No results for input(s): PROBNP in the last 8760  hours.  CBG:  Recent Labs Lab 11/09/15 0648 11/09/15 1105 11/09/15  1639 11/09/15 2116 11/10/15 0607  GLUCAP 166* 173* 207* 185* 188*       Signed:  Edsel Petrin  [facility] 11/10/2015, 10:56 AM

## 2015-11-10 NOTE — Progress Notes (Signed)
Provided HF education via HF booklet and video, pt showed understanding to it.

## 2015-11-10 NOTE — Discharge Instructions (Signed)

## 2015-11-10 NOTE — Progress Notes (Signed)
Referral made to [facility] for sleep study; they will call her for a date and time of her test. [facility] called for home 02 to be delivered to her room today prior to going home. Abelino DerrickB Sheron Tallman [facility] HospitalRN,MHA,BSN [phone]

## 2015-11-10 NOTE — Progress Notes (Signed)
Patient is discharge to home accompanied by NT and family members via wheelchair/private car. Prescription, discharge instructions, medication regimen , and education given. Patient verbalized understanding. Patient also watch the heart failure video. All personal belongings given. Patient' stable. No signs and symptoms of distress noted. On O2 2L upon discharge. Telemetry box and IV removed prior to discharge.

## 2015-11-11 ENCOUNTER — Ambulatory Visit: Payer: Medicare Other | Admitting: Cardiology

## 2015-12-18 NOTE — Progress Notes (Signed)
Patient did not present for her appt.  We called and she told us that she cancelled it.

## 2015-12-19 ENCOUNTER — Encounter: Payer: Medicare Other | Admitting: Cardiology

## 2016-01-11 ENCOUNTER — Ambulatory Visit (HOSPITAL_BASED_OUTPATIENT_CLINIC_OR_DEPARTMENT_OTHER): Payer: Medicare Other

## 2016-03-20 ENCOUNTER — Encounter (HOSPITAL_COMMUNITY): Payer: Self-pay | Admitting: Emergency Medicine

## 2016-03-20 ENCOUNTER — Emergency Department (HOSPITAL_COMMUNITY): Payer: Medicare Other

## 2016-03-20 DIAGNOSIS — J9621 Acute and chronic respiratory failure with hypoxia: Secondary | ICD-10-CM | POA: Diagnosis present

## 2016-03-20 DIAGNOSIS — Z6841 Body Mass Index (BMI) 40.0 and over, adult: Secondary | ICD-10-CM

## 2016-03-20 DIAGNOSIS — E119 Type 2 diabetes mellitus without complications: Secondary | ICD-10-CM | POA: Diagnosis present

## 2016-03-20 DIAGNOSIS — J189 Pneumonia, unspecified organism: Principal | ICD-10-CM | POA: Diagnosis present

## 2016-03-20 DIAGNOSIS — I110 Hypertensive heart disease with heart failure: Secondary | ICD-10-CM | POA: Diagnosis present

## 2016-03-20 DIAGNOSIS — Z794 Long term (current) use of insulin: Secondary | ICD-10-CM

## 2016-03-20 DIAGNOSIS — Z7951 Long term (current) use of inhaled steroids: Secondary | ICD-10-CM

## 2016-03-20 DIAGNOSIS — Z9981 Dependence on supplemental oxygen: Secondary | ICD-10-CM

## 2016-03-20 DIAGNOSIS — I5032 Chronic diastolic (congestive) heart failure: Secondary | ICD-10-CM | POA: Diagnosis present

## 2016-03-20 DIAGNOSIS — E662 Morbid (severe) obesity with alveolar hypoventilation: Secondary | ICD-10-CM | POA: Diagnosis present

## 2016-03-20 LAB — CBC
HCT: 43.2 % (ref 36.0–46.0)
Hemoglobin: 14 g/dL (ref 12.0–15.0)
MCH: 28.3 pg (ref 26.0–34.0)
MCHC: 32.4 g/dL (ref 30.0–36.0)
MCV: 87.4 fL (ref 78.0–100.0)
PLATELETS: 252 10*3/uL (ref 150–400)
RBC: 4.94 MIL/uL (ref 3.87–5.11)
RDW: 14.3 % (ref 11.5–15.5)
WBC: 10.9 10*3/uL — ABNORMAL HIGH (ref 4.0–10.5)

## 2016-03-20 LAB — BASIC METABOLIC PANEL
Anion gap: 10 (ref 5–15)
BUN: 20 mg/dL (ref 6–20)
CHLORIDE: 99 mmol/L — AB (ref 101–111)
CO2: 28 mmol/L (ref 22–32)
CREATININE: 1.14 mg/dL — AB (ref 0.44–1.00)
Calcium: 9 mg/dL (ref 8.9–10.3)
GFR calc Af Amer: 57 mL/min — ABNORMAL LOW (ref >60)
GFR calc non Af Amer: 49 mL/min — ABNORMAL LOW (ref >60)
Glucose, Bld: 212 mg/dL — ABNORMAL HIGH (ref 65–99)
Potassium: 3.9 mmol/L (ref 3.5–5.1)
Sodium: 137 mmol/L (ref 135–145)

## 2016-03-20 LAB — I-STAT TROPONIN, ED: Troponin i, poc: 0 ng/mL (ref 0.00–0.08)

## 2016-03-20 MED ORDER — ALBUTEROL SULFATE (2.5 MG/3ML) 0.083% IN NEBU
INHALATION_SOLUTION | RESPIRATORY_TRACT | Status: AC
  Administered 2016-03-20: 5 mg
  Filled 2016-03-20: qty 6

## 2016-03-20 NOTE — ED Notes (Signed)
Pt. reports progressing SOB with productive cough /wheezing onset 3 days ago , denies fever or chills.

## 2016-03-21 ENCOUNTER — Inpatient Hospital Stay (HOSPITAL_COMMUNITY)
Admission: EM | Admit: 2016-03-21 | Discharge: 2016-03-22 | DRG: 193 | Disposition: A | Discharge status: Home / Self Care | Payer: Medicare Other | Attending: Family Medicine | Admitting: Family Medicine

## 2016-03-21 ENCOUNTER — Encounter (HOSPITAL_COMMUNITY): Payer: Self-pay

## 2016-03-21 DIAGNOSIS — Z7951 Long term (current) use of inhaled steroids: Secondary | ICD-10-CM | POA: Diagnosis not present

## 2016-03-21 DIAGNOSIS — I10 Essential (primary) hypertension: Secondary | ICD-10-CM | POA: Diagnosis not present

## 2016-03-21 DIAGNOSIS — Z794 Long term (current) use of insulin: Secondary | ICD-10-CM | POA: Diagnosis not present

## 2016-03-21 DIAGNOSIS — J189 Pneumonia, unspecified organism: Secondary | ICD-10-CM | POA: Diagnosis present

## 2016-03-21 DIAGNOSIS — Z6841 Body Mass Index (BMI) 40.0 and over, adult: Secondary | ICD-10-CM | POA: Diagnosis not present

## 2016-03-21 DIAGNOSIS — I110 Hypertensive heart disease with heart failure: Secondary | ICD-10-CM | POA: Diagnosis present

## 2016-03-21 DIAGNOSIS — J9621 Acute and chronic respiratory failure with hypoxia: Secondary | ICD-10-CM | POA: Diagnosis present

## 2016-03-21 DIAGNOSIS — E119 Type 2 diabetes mellitus without complications: Secondary | ICD-10-CM

## 2016-03-21 DIAGNOSIS — E662 Morbid (severe) obesity with alveolar hypoventilation: Secondary | ICD-10-CM | POA: Diagnosis present

## 2016-03-21 DIAGNOSIS — I5032 Chronic diastolic (congestive) heart failure: Secondary | ICD-10-CM | POA: Diagnosis not present

## 2016-03-21 DIAGNOSIS — R05 Cough: Secondary | ICD-10-CM | POA: Diagnosis present

## 2016-03-21 DIAGNOSIS — Z9981 Dependence on supplemental oxygen: Secondary | ICD-10-CM | POA: Diagnosis not present

## 2016-03-21 LAB — INFLUENZA PANEL BY PCR (TYPE A & B)
H1N1FLUPCR: NOT DETECTED
Influenza A By PCR: NEGATIVE
Influenza B By PCR: NEGATIVE

## 2016-03-21 LAB — CBC
HEMATOCRIT: 40.5 % (ref 36.0–46.0)
Hemoglobin: 13.1 g/dL (ref 12.0–15.0)
MCH: 28.4 pg (ref 26.0–34.0)
MCHC: 32.3 g/dL (ref 30.0–36.0)
MCV: 87.7 fL (ref 78.0–100.0)
Platelets: 226 10*3/uL (ref 150–400)
RBC: 4.62 MIL/uL (ref 3.87–5.11)
RDW: 14.4 % (ref 11.5–15.5)
WBC: 12 10*3/uL — AB (ref 4.0–10.5)

## 2016-03-21 LAB — GLUCOSE, CAPILLARY
GLUCOSE-CAPILLARY: 267 mg/dL — AB (ref 65–99)
Glucose-Capillary: 174 mg/dL — ABNORMAL HIGH (ref 65–99)
Glucose-Capillary: 207 mg/dL — ABNORMAL HIGH (ref 65–99)
Glucose-Capillary: 176 mg/dL — ABNORMAL HIGH (ref 65–99)

## 2016-03-21 LAB — BRAIN NATRIURETIC PEPTIDE: B NATRIURETIC PEPTIDE 5: 29.6 pg/mL (ref 0.0–100.0)

## 2016-03-21 MED ORDER — ONDANSETRON HCL 4 MG/2ML IJ SOLN: 4.0000 mg | Freq: Four times a day (QID) | INTRAMUSCULAR | Status: DC | PRN

## 2016-03-21 MED ORDER — DEXTROSE 5 % IV SOLN
500.0000 mg | Freq: Once | INTRAVENOUS | Status: DC
  Filled 2016-03-21: qty 500

## 2016-03-21 MED ORDER — ONDANSETRON 4 MG PO TBDP
4.0000 mg | ORAL_TABLET | Freq: Once | ORAL | Status: AC
  Administered 2016-03-21: 4 mg via ORAL
  Filled 2016-03-21: qty 1

## 2016-03-21 MED ORDER — INSULIN ASPART 100 UNIT/ML ~~LOC~~ SOLN
0.0000 [IU] | Freq: Three times a day (TID) | SUBCUTANEOUS | Status: DC
  Administered 2016-03-21 (×2): 4 [IU] via SUBCUTANEOUS
  Administered 2016-03-21: 7 [IU] via SUBCUTANEOUS
  Administered 2016-03-22 (×2): 4 [IU] via SUBCUTANEOUS

## 2016-03-21 MED ORDER — ONDANSETRON HCL 4 MG PO TABS: 4.0000 mg | ORAL_TABLET | Freq: Four times a day (QID) | ORAL | Status: DC | PRN

## 2016-03-21 MED ORDER — AZITHROMYCIN 500 MG PO TABS
500.0000 mg | ORAL_TABLET | ORAL | Status: DC
  Administered 2016-03-21 – 2016-03-22 (×2): 500 mg via ORAL
  Filled 2016-03-21 (×3): qty 1

## 2016-03-21 MED ORDER — GUAIFENESIN-CODEINE 100-10 MG/5ML PO SOLN
10.0000 mL | Freq: Once | ORAL | Status: AC
  Administered 2016-03-21: 10 mL via ORAL
  Filled 2016-03-21: qty 10

## 2016-03-21 MED ORDER — ACETAMINOPHEN 500 MG PO TABS
1000.0000 mg | ORAL_TABLET | Freq: Once | ORAL | Status: AC
  Administered 2016-03-21: 1000 mg via ORAL
  Filled 2016-03-21: qty 2

## 2016-03-21 MED ORDER — DEXTROSE 5 % IV SOLN
1.0000 g | Freq: Once | INTRAVENOUS | Status: AC
  Administered 2016-03-21: 1 g via INTRAVENOUS
  Filled 2016-03-21: qty 10

## 2016-03-21 MED ORDER — CLOBETASOL PROPIONATE 0.05 % EX OINT
1.0000 "application " | TOPICAL_OINTMENT | Freq: Two times a day (BID) | CUTANEOUS | Status: DC
  Filled 2016-03-21: qty 15

## 2016-03-21 MED ORDER — IPRATROPIUM-ALBUTEROL 0.5-2.5 (3) MG/3ML IN SOLN
3.0000 mL | RESPIRATORY_TRACT | Status: DC | PRN
  Administered 2016-03-21: 3 mL via RESPIRATORY_TRACT
  Filled 2016-03-21: qty 3

## 2016-03-21 MED ORDER — ALBUTEROL SULFATE HFA 108 (90 BASE) MCG/ACT IN AERS: 2.0000 | INHALATION_SPRAY | Freq: Four times a day (QID) | RESPIRATORY_TRACT | Status: DC | PRN

## 2016-03-21 MED ORDER — LORATADINE 10 MG PO TABS
10.0000 mg | ORAL_TABLET | Freq: Every day | ORAL | Status: DC
  Administered 2016-03-21 – 2016-03-22 (×2): 10 mg via ORAL
  Filled 2016-03-21 (×2): qty 1

## 2016-03-21 MED ORDER — ACETAMINOPHEN 325 MG PO TABS
650.0000 mg | ORAL_TABLET | Freq: Four times a day (QID) | ORAL | Status: DC | PRN
  Administered 2016-03-21 – 2016-03-22 (×3): 650 mg via ORAL
  Filled 2016-03-21 (×3): qty 2

## 2016-03-21 MED ORDER — ACETAMINOPHEN 650 MG RE SUPP
650.0000 mg | Freq: Four times a day (QID) | RECTAL | Status: DC | PRN
Start: 2016-03-21 — End: 2016-03-22

## 2016-03-21 MED ORDER — LOSARTAN POTASSIUM 25 MG PO TABS: 25.0000 mg | ORAL_TABLET | Freq: Every day | ORAL | Status: DC

## 2016-03-21 MED ORDER — DEXTROSE 5 % IV SOLN
1.0000 g | INTRAVENOUS | Status: DC
  Administered 2016-03-22: 1 g via INTRAVENOUS
  Filled 2016-03-21 (×2): qty 10

## 2016-03-21 MED ORDER — INSULIN ASPART 100 UNIT/ML ~~LOC~~ SOLN
6.0000 [IU] | Freq: Three times a day (TID) | SUBCUTANEOUS | Status: DC
  Administered 2016-03-21 – 2016-03-22 (×5): 6 [IU] via SUBCUTANEOUS

## 2016-03-21 MED ORDER — ASPIRIN EC 81 MG PO TBEC
81.0000 mg | DELAYED_RELEASE_TABLET | Freq: Every day | ORAL | Status: DC
  Administered 2016-03-21: 81 mg via ORAL
  Filled 2016-03-21: qty 1

## 2016-03-21 MED ORDER — INSULIN ASPART 100 UNIT/ML ~~LOC~~ SOLN
0.0000 [IU] | Freq: Every day | SUBCUTANEOUS | Status: DC
  Administered 2016-03-21: 3 [IU] via SUBCUTANEOUS

## 2016-03-21 MED ORDER — ENOXAPARIN SODIUM 40 MG/0.4ML ~~LOC~~ SOLN
40.0000 mg | SUBCUTANEOUS | Status: DC
  Administered 2016-03-21 – 2016-03-22 (×2): 40 mg via SUBCUTANEOUS
  Filled 2016-03-21 (×3): qty 0.4

## 2016-03-21 MED ORDER — TRAMADOL HCL 50 MG PO TABS
50.0000 mg | ORAL_TABLET | Freq: Two times a day (BID) | ORAL | Status: DC | PRN
  Administered 2016-03-21: 50 mg via ORAL
  Filled 2016-03-21 (×2): qty 1

## 2016-03-21 MED ORDER — LOSARTAN POTASSIUM 50 MG PO TABS
100.0000 mg | ORAL_TABLET | Freq: Every day | ORAL | Status: DC
  Administered 2016-03-21 – 2016-03-22 (×2): 100 mg via ORAL
  Filled 2016-03-21 (×2): qty 2

## 2016-03-21 MED ORDER — INSULIN GLARGINE 100 UNIT/ML ~~LOC~~ SOLN
40.0000 [IU] | Freq: Two times a day (BID) | SUBCUTANEOUS | Status: DC
Start: 2016-03-21 — End: 2016-03-22
  Administered 2016-03-21 – 2016-03-22 (×3): 40 [IU] via SUBCUTANEOUS
  Filled 2016-03-21 (×4): qty 0.4

## 2016-03-21 MED ORDER — AMLODIPINE BESYLATE 10 MG PO TABS
10.0000 mg | ORAL_TABLET | Freq: Every day | ORAL | Status: DC
  Administered 2016-03-21: 10 mg via ORAL
  Filled 2016-03-21: qty 1

## 2016-03-21 MED ORDER — FUROSEMIDE 40 MG PO TABS
40.0000 mg | ORAL_TABLET | Freq: Every day | ORAL | Status: DC
  Administered 2016-03-21 – 2016-03-22 (×2): 40 mg via ORAL
  Filled 2016-03-21 (×2): qty 1

## 2016-03-21 MED ORDER — VITAMIN D 1000 UNITS PO TABS
2000.0000 [IU] | ORAL_TABLET | Freq: Every day | ORAL | Status: DC
  Administered 2016-03-21 – 2016-03-22 (×2): 2000 [IU] via ORAL
  Filled 2016-03-21 (×2): qty 2

## 2016-03-21 MED ORDER — FLUTICASONE PROPIONATE 50 MCG/ACT NA SUSP
1.0000 | Freq: Two times a day (BID) | NASAL | Status: DC
  Administered 2016-03-21 – 2016-03-22 (×2): 1 via NASAL
  Filled 2016-03-21: qty 16

## 2016-03-21 NOTE — H&P (Addendum)
[facility] History and Physical  Dorothy Hammond ZOX:[id number] DOB: [birth year] DOA: 03/21/2016  Referring physician:  PCP: Aldona Bar, MD   Chief Complaint: Been coughing worse over the past 4 days  HPI: Dorothy Hammond is a 65 y.o. female past medical history significant for morbid obesity, hypertension, insulin-dependent diabetes mellitus and diastolic congestive heart failure [facility] because she said over the past 4 days patient's been having a cough productive of yellowish sputum associated yesterday with some chills, subjective fever. Low energy and has been having associated wheezing. Does not normally carry the diagnosis of asthma or COPD. Does not have any breathing treatments at home. I said after she was diagnosed with CHF she does require 2 L of oxygen continuously. She's been wearing that but said that she gets exertional dyspnea with minimal activity. [place] Spaniel recently her granddaughter had an ear infection and fever. Said that anytime her granddaughter gets sick the patient develops similar symptoms. She was lasted minutes of a hospital in about December January for similar problem. Patient says she chronically sleeps with her head elevated, has gained about 6 pounds in the past month, reported some mild lower extremity edema. She's been taking her 40 mg of Lasix daily as prescribed.  In the emergency room patient has been afebrile, she's currently on 3 L nasal cannula saturating 97% but according to the ED when she came in on room air she was in the mid 80s. Seizure normal blood cell count of 10.9, metabolic panel showed mildly elevated creatinine of 1.14 and a glucose of 212. Troponin was 0, brain x-ray peptide was normal at 26. Chest x-ray interpreted as patchy bilateral infiltrates consistent with pneumonia. Patient was started on a dose of ceftriaxone and azithromycin in the hospital was called to admit.    Review of Systems:  Constitutional:  No weight loss, night sweats,  patient reported subjective fever, has had chills and fatigue  HEENT:  No headaches, Difficulty swallowing,Tooth/dental problems,Sore throat, Cardio-vascular:  No chest pain, Orthopnea, PND, patient does have swelling in lower extremities, anasarca, denied dizziness, palpitations  GI:  No heartburn, indigestion, abdominal pain, nausea, vomiting, diarrhea, change in bowel habits, loss of appetite  Resp: Cough. History of present illness productive of yellowish sputum, associated wheezing and exertional dyspnea  Skin:  no rash or lesions.  GU:  no dysuria, change in color of urine, no urgency or frequency. No flank pain.  Musculoskeletal:   No joint pain or swelling. No decreased range of motion. No back pain.  Psych:  No change in mood or affect. No depression or anxiety. No memory loss.  Neuro:  No change in sensation, unilateral strength, or cognitive abilities  All other systems were reviewed and are negative.  Past Medical History  Diagnosis Date  . Diabetes mellitus without complication (HCC)   . COPD (chronic obstructive pulmonary disease) (HCC)   . HTN (hypertension)    Past Surgical History  Procedure Laterality Date  . Cholecystectomy    . Cesarean section     Social History:  reports that she has never smoked. She does not have any smokeless tobacco history on file. She reports that she does not drink alcohol or use illicit drugs.  Allergies  Allergen Reactions  . Metformin And Related Diarrhea and Other (See Comments)    Stomach pains also    No family history on file.   Prior to Admission medications   Medication Sig Start Date End Date Taking? Authorizing Provider  albuterol (PROAIR HFA) 108 (90  BASE) MCG/ACT inhaler Inhale 2 puffs into the lungs every 6 (six) hours as needed for wheezing or shortness of breath.   Yes Historical Provider, MD  amLODipine (NORVASC) 10 MG tablet Take 10 mg by mouth at bedtime.    Yes Historical Provider, MD  aspirin EC 81 MG  tablet Take 81 mg by mouth at bedtime.    Yes Historical Provider, MD  Cholecalciferol (VITAMIN D) 2000 UNITS CAPS Take 2,000 Units by mouth daily.   Yes Historical Provider, MD  clobetasol (TEMOVATE) 0.05 % GEL Apply 1 application topically 2 (two) times daily. 10/19/15  Yes Historical Provider, MD  fexofenadine (ALLEGRA) 180 MG tablet Take 180 mg by mouth daily as needed for allergies or rhinitis.   Yes Historical Provider, MD  fluticasone (FLONASE) 50 MCG/ACT nasal spray Place 1 spray into both nostrils 2 (two) times daily.   Yes Historical Provider, MD  furosemide (LASIX) 40 MG tablet Take 1 tablet (40 mg total) by mouth daily. 11/10/15  Yes Maryann Mikhail, DO  insulin aspart (NOVOLOG FLEXPEN) 100 UNIT/ML FlexPen Inject 20 Units into the skin 3 (three) times daily with meals.    Yes Historical Provider, MD  insulin glargine (LANTUS) 100 unit/mL SOPN Inject 0.4 mLs (40 Units total) into the skin 2 (two) times daily. 11/10/15  Yes Maryann Mikhail, DO  losartan (COZAAR) 100 MG tablet Take 100 mg by mouth daily.   Yes Historical Provider, MD  losartan (COZAAR) 25 MG tablet Take 1 tablet (25 mg total) by mouth daily. 11/10/15  Yes Maryann Mikhail, DO  traMADol (ULTRAM) 50 MG tablet Take 50 mg by mouth every 12 (twelve) hours as needed for moderate pain.   Yes Historical Provider, MD  pseudoephedrine (SUDAFED) 30 MG tablet Take 1 tablet (30 mg total) by mouth every 6 (six) hours as needed for congestion. Patient not taking: Reported on 03/21/2016 11/10/15   Edsel PetrinMaryann Mikhail, DO   Physical Exam: Filed Vitals:   03/20/16 2212 03/21/16 0200  BP: 164/74 120/61  Pulse: 82 82  Temp: 99.8 F (37.7 C)   TempSrc: Oral   Resp: 18 24  Height: 4\' 7"  (1.397 m)   Weight: 115.214 kg (254 lb)   SpO2: 92% 96%    Wt Readings from Last 3 Encounters:  03/20/16 115.214 kg (254 lb)  11/10/15 117.209 kg (258 lb 6.4 oz)  08/02/15 117.8 kg (259 lb 11.2 oz)    General:  Appears calm and comfortable Eyes:   PERRL, EOMI, normal lids, iris ENT:  grossly normal hearing, lips & tongue Neck:  no LAD, masses or thyromegaly Cardiovascular:  RRR, no m/r/g. No LE edema.  Respiratory:  Normal respiratory effort, she has some scant crackles bilateral, no appreciated wheezing, mild rhonchi Abdomen:  soft, ntnd Skin:  no rash or induration seen on limited exam Musculoskeletal:  grossly normal tone BUE/BLE Psychiatric:  grossly normal mood and affect, speech fluent and appropriate Neurologic:  CN 2-12 grossly intact, moves all extremities in coordinated fashion.          Labs on Admission:  Basic Metabolic Panel:  Recent Labs Lab 03/20/16 2207  NA 137  K 3.9  CL 99*  CO2 28  GLUCOSE 212*  BUN 20  CREATININE 1.14*  CALCIUM 9.0   Liver Function Tests: No results for input(s): AST, ALT, ALKPHOS, BILITOT, PROT, ALBUMIN in the last 168 hours. No results for input(s): LIPASE, AMYLASE in the last 168 hours. No results for input(s): AMMONIA in the last 168 hours. CBC:  Recent Labs Lab 03/20/16 2207  WBC 10.9*  HGB 14.0  HCT 43.2  MCV 87.4  PLT 252   Cardiac Enzymes: No results for input(s): CKTOTAL, CKMB, CKMBINDEX, TROPONINI in the last 168 hours.  BNP (last 3 results)  Recent Labs  08/02/15 0203 11/06/15 2006 03/20/16 2207  BNP 36.9 35.0 29.6    ProBNP (last 3 results) No results for input(s): PROBNP in the last 8760 hours.   CREATININE: 1.14 mg/dL ABNORMAL (04/54/09 8119) Estimated creatinine clearance - 51 mL/min  CBG: No results for input(s): GLUCAP in the last 168 hours.  Radiological Exams on Admission: Dg Chest 2 View  03/20/2016  CLINICAL DATA:  Acute onset of shortness of breath, wheezing and productive cough. Initial encounter. EXAM: CHEST  2 VIEW COMPARISON:  Chest radiograph from 11/06/2015 FINDINGS: The lungs are well-aerated. Patchy bilateral airspace opacities are compatible with pneumonia. There is no evidence of pleural effusion or pneumothorax. The heart  is borderline normal in size. No acute osseous abnormalities are seen. Clips are noted within the right upper quadrant, reflecting prior cholecystectomy. IMPRESSION: Patchy bilateral airspace opacities are compatible with pneumonia. Followup PA and lateral chest X-ray is recommended in 3-4 weeks following trial of antibiotic therapy to ensure resolution and exclude underlying malignancy. Electronically Signed   By: Roanna Raider M.D.   On: 03/20/2016 22:43    EKG: Independently reviewed. Sinus rhythm, right bundle-branch block  Assessment/Plan Active Problems:   CAP (community acquired pneumonia)   Pneumonia   1. Bilateral community-acquired pneumonia. Has not been hospitalized in the past 3 months, came to hospital from home. Patient has some scant rhonchi. Her dyspnea is likely multifactorial given her prior history of diastolic CHF. She had a normal BNP and troponin was 0. Morbid obesity and pickwickian syndrome likely contributing. We'll continue ceftriaxone and azithromycin started in the emergency department. Follow-up sputum culture as well as urine streptococcal and Legionella urine antigen and influenza swab.  2. Morbid obesity. Complicates care, encourage diet and lifestyle modification   3. Hypertension. Blood pressure controlled at this time, continue amlodipine 10 mg daily and losartan 125 mg daily  4. Diastolic congestive heart failure. Patient had an echocardiogram done in September 2016 which showed diastolic CHF with an EF of 65-70%. Continue patients antihypertensives including Lasix 40 mg daily as above. BNP normal at 26, troponin was 0.  5. Insulin-dependent diabetes mellitus. Continue Lantus 100 units twice a day, insulin sliding scale with bolus meal dosing. Check hemoglobin A1c.  Code Status: Full code  DVT Prophylaxis: Lovenox 40 mg daily Family Communication: Absent from bedside Disposition Plan: Pending Improvement    Joella Prince, MD [facility] [email or web address] Password [email or web address]

## 2016-03-21 NOTE — Progress Notes (Signed)
Pt's flu PCR came back negative, Dr Lendell CapriceSullivan paged and notified, droplet precaution d/c at this time, will continue to monitor. Obasogie-Asidi, Maika Mcelveen Efe

## 2016-03-21 NOTE — Care Management Note (Signed)
Case Management Note  Patient Details  Name: Dorothy Hammond MRN: [id number] Date of Birth: 07/19/1951  Subjective/Objective:                    Action/Plan: Patient was admitted with pneumonia.  Lives at home with family. Per chart, patient requires home oxygen with a baseline of 2L via nasal cannula.  Will follow for discharge needs pending patient's progress and physician orders.  Expected Discharge Date:                  Expected Discharge Plan:     In-House Referral:     Discharge planning Services     Post Acute Care Choice:    Choice offered to:     DME Arranged:    DME Agency:     HH Arranged:    HH Agency:     Status of Service:  In process, will continue to follow  Medicare Important Message Given:    Date Medicare IM Given:    Medicare IM give by:    Date Additional Medicare IM Given:    Additional Medicare Important Message give by:     If discussed at Long Length of Stay Meetings, dates discussed:    Additional CommentsAnda Kraft:  Devine Dant C, RN 03/21/2016, 9:15 AM [phone]

## 2016-03-21 NOTE — Progress Notes (Signed)
Pt seen and examined, admitted earlier this am by Dr.Sullivan 64/F with DM, morbid obesity, Diastolic CHF, admitted with Cough, dyspnea, wheezing, CXR with CAP No h/o Asthma or COPD, h/o exposure to excessive second hand smoker from spouse, h/o bedside spirometry at [facility]-no COPD per Pt, will need PFTs in Pulm office. H/o recurrent pneumonias and some intermittent swallowing issues Will ask SLP to evaluate FLu PCR negative, continue current Abx and PO lasix  Dorothy CovePreetha Omari Koslosky, MD

## 2016-03-21 NOTE — ED Notes (Signed)
Admitting at bedside 

## 2016-03-21 NOTE — Evaluation (Signed)
Clinical/Bedside Swallow Evaluation Patient Details  Name: Duwaine MaxinLuz Pomerleau MRN: [id number] Date of Birth: 01/16/1951  Today's Date: 03/21/2016 Time: SLP Start Time (ACUTE ONLY): 1330 SLP Stop Time (ACUTE ONLY): 1400 SLP Time Calculation (min) (ACUTE ONLY): 30 min  Past Medical History:  Past Medical History  Diagnosis Date  . Diabetes mellitus without complication (HCC)   . COPD (chronic obstructive pulmonary disease) (HCC)   . HTN (hypertension)    Past Surgical History:  Past Surgical History  Procedure Laterality Date  . Cholecystectomy    . Cesarean section     HPI:  64/F with DM, morbid obesity, Diastolic CHF, admitted with Cough, dyspnea, wheezing, CXR with CAP, h/o recurrent PNA (4 since 2015)   Assessment / Plan / Recommendation Clinical Impression  Pt referred for clinical assessment of swalow function due to recurrent PNA (4 since 2015 per pt). In addition to known medical history, pt offered the following information: exposure to dust/debris for 1 year following 9/11 - worked 2 blocks away, pt notices her PNA has corresponded with her grandchild being ill with requirement for abx, and swallow complaints are consistent with GERD although pt denies sensation of GERD except with greasy foods. In general, pt tolerated all PO consistencies WNL. No s/s aspiration. No further SLP intervention planned at this time. Encouraged upright positioning and discussed energy conservation as related to eating and respiratory status. Pt does endorse occasionally eating when SOB- discouraged this behavior. Pt verbalized understanding and agreement.    Aspiration Risk  Mild aspiration risk    Diet Recommendation Regular;Thin liquid   Liquid Administration via: Cup;Straw Medication Administration: Whole meds with liquid Supervision: Patient able to self feed Postural Changes: Seated upright at 90 degrees;Remain upright for at least 30 minutes after po intake    Other  Recommendations Oral Care  Recommendations: Oral care BID   Follow up Recommendations  None    Frequency and Duration            Prognosis        Swallow Study   General Date of Onset: 03/20/16 HPI: 64/F with DM, morbid obesity, Diastolic CHF, admitted with Cough, dyspnea, wheezing, CXR with CAP, h/o recurrent PNA (4 since 2015) Type of Study: Bedside Swallow Evaluation Previous Swallow Assessment: none Diet Prior to this Study: Regular;Thin liquids Temperature Spikes Noted: No Respiratory Status: Nasal cannula History of Recent Intubation: No Behavior/Cognition: Alert;Cooperative;Pleasant mood Oral Cavity Assessment: Within Functional Limits Oral Care Completed by SLP: No Oral Cavity - Dentition: Adequate natural dentition Vision: Functional for self-feeding Self-Feeding Abilities: Able to feed self Patient Positioning: Upright in bed (EOB) Baseline Vocal Quality: Normal Volitional Cough: Strong Volitional Swallow: Able to elicit    Oral/Motor/Sensory Function Overall Oral Motor/Sensory Function: Within functional limits   Ice Chips Ice chips: Within functional limits Presentation: Spoon   Thin Liquid Thin Liquid: Within functional limits Presentation: Cup;Straw    Nectar Thick     Honey Thick     Puree Puree: Within functional limits Presentation: Self Fed;Spoon   Solid   GO   Solid: Within functional limits Presentation: Self Fed        Rocky CraftsKara E Makayela Secrest MA, CCC-SLP Pager [phone] 03/21/2016,2:13 PM

## 2016-03-21 NOTE — Progress Notes (Signed)
Pt admitted from ED with SOB and productive cough, alert and oriented, denies any pain at this time, pt settled in bed with call light at bedside, pt reassured, will however continue to monitor. Obasogie-Asidi, Jermel Artley Efe

## 2016-03-21 NOTE — ED Provider Notes (Signed)
By signing my name below, I, Freida BusmanDiana Omoyeni, attest that this documentation has been prepared under the direction and in the presence of Cherylann Hobday N Leslieanne Cobarrubias, DO . Electronically Signed: Freida Busmaniana Omoyeni, Scribe. 03/21/2016. 1:38 AM.  TIME SEEN: 1:18 AM  CHIEF COMPLAINT:  Chief Complaint  Patient presents with  . Cough  . Wheezing  . Shortness of Breath    HPI:   HPI Comments:  Dorothy Hammond is a 65 y.o. female with a history of HTN, Recurrent PNA, and CHF (Echo in 2016 showed grade 1 diastolic dysfunction with EF of 65-70%), who presents to the Emergency Department complaining of productive cough x ~1 week. She notes her cough has worsened in the last 3 days with increased SOB.  She reports associated wheezing and CP with productive cough. Pt wears 2 L O2 at home since hospitalization in December 2016. No alleviating factors noted. Family notes recent sick contacts at home. Pt was last admitted in December 2016 for acute CHF. She denies h/o asthma, COPD and smoking hx. denies history of PE or DVT. No lower extremity swelling. Has had chills. Oral temperature here of 99.8. Her granddaughter has had recent similar symptoms. She reports chest soreness with coughing.  PCP- Aldona Barlaudia Campos [facility]   ROS: See HPI Constitutional: no fever  Eyes: no drainage  ENT: no runny nose   Cardiovascular: chest pain  Resp: SOB  GI: no vomiting GU: no dysuria Integumentary: no rash  Allergy: no hives  Musculoskeletal: no leg swelling  Neurological: no slurred speech ROS otherwise negative  PAST MEDICAL HISTORY/PAST SURGICAL HISTORY:  Past Medical History  Diagnosis Date  . Diabetes mellitus without complication (HCC)   . COPD (chronic obstructive pulmonary disease) (HCC)   . HTN (hypertension)     MEDICATIONS:  Prior to Admission medications   Medication Sig Start Date End Date Taking? Authorizing Provider  albuterol (PROAIR HFA) 108 (90 BASE) MCG/ACT inhaler Inhale 2 puffs into the lungs every 6  (six) hours as needed for wheezing or shortness of breath.    Historical Provider, MD  amLODipine (NORVASC) 10 MG tablet Take 10 mg by mouth at bedtime.     Historical Provider, MD  aspirin EC 81 MG tablet Take 81 mg by mouth at bedtime.     Historical Provider, MD  budesonide-formoterol (SYMBICORT) 160-4.5 MCG/ACT inhaler Inhale 2 puffs into the lungs 2 (two) times daily.    Historical Provider, MD  Cholecalciferol (VITAMIN D) 2000 UNITS CAPS Take 2,000 Units by mouth daily.    Historical Provider, MD  clobetasol (TEMOVATE) 0.05 % GEL Apply 1 application topically 2 (two) times daily. 10/19/15   Historical Provider, MD  fexofenadine (ALLEGRA) 180 MG tablet Take 180 mg by mouth daily as needed for allergies or rhinitis.    Historical Provider, MD  fluticasone (FLONASE) 50 MCG/ACT nasal spray Place 1 spray into both nostrils 2 (two) times daily.    Historical Provider, MD  furosemide (LASIX) 40 MG tablet Take 1 tablet (40 mg total) by mouth daily. 11/10/15   Maryann Mikhail, DO  ibuprofen (ADVIL,MOTRIN) 200 MG tablet Take 800 mg by mouth every 6 (six) hours as needed for fever or moderate pain.     Historical Provider, MD  insulin aspart (NOVOLOG FLEXPEN) 100 UNIT/ML FlexPen Inject 20 Units into the skin 3 (three) times daily with meals.     Historical Provider, MD  insulin glargine (LANTUS) 100 unit/mL SOPN Inject 0.4 mLs (40 Units total) into the skin 2 (two) times daily.  11/10/15   Maryann Mikhail, DO  losartan (COZAAR) 25 MG tablet Take 1 tablet (25 mg total) by mouth daily. 11/10/15   Maryann Mikhail, DO  pseudoephedrine (SUDAFED) 30 MG tablet Take 1 tablet (30 mg total) by mouth every 6 (six) hours as needed for congestion. 11/10/15   Maryann Mikhail, DO  traMADol (ULTRAM) 50 MG tablet Take 50 mg by mouth every 12 (twelve) hours as needed for moderate pain.    Historical Provider, MD  trolamine salicylate (ASPERCREME) 10 % cream Apply 1 application topically at bedtime.    Historical Provider, MD     ALLERGIES:  Allergies  Allergen Reactions  . Metformin And Related Diarrhea and Other (See Comments)    Stomach pains also    SOCIAL HISTORY:  Social History  Substance Use Topics  . Smoking status: Never Smoker   . Smokeless tobacco: Not on file  . Alcohol Use: No    FAMILY HISTORY: No family history on file.  EXAM: BP 164/74 mmHg  Pulse 82  Temp(Src) 99.8 F (37.7 C) (Oral)  Resp 18  Ht  (1.397 m)  Wt 254 lb (115.214 kg)  BMI 59.04 kg/m2  SpO2 92% CONSTITUTIONAL: Alert and oriented and responds appropriately to questions. Chronically ill appearing; Non-toxic; well-nourished HEAD: Normocephalic EYES: Conjunctivae clear, PERRL ENT: normal nose; no rhinorrhea; moist mucous membranes; No pharyngeal erythema or petechiae, no tonsillar hypertrophy or exudate, no uvular deviation, no trismus or drooling, normal phonation, no stridor, no dental caries or abscess noted, no Ludwig's angina, tongue sits flat in the bottom of the mouth; TMs are clear bilaterally without erythema, purulence, bulging, perforation, effusion.  No cerumen impaction or sign of foreign body in the external auditory canal. No inflammation, erythema or drainage from the external auditory canal. No signs of mastoiditis. No pain with manipulation of the pinna bilaterally. NECK: Supple, no meningismus, no LAD  CARD: RRR; S1 and S2 appreciated; no murmurs, no clicks, no rubs, no gallops RESP: Normal chest excursion without splinting; breath sounds equal bilaterally; mild diffuse scattered expiratory wheezing; dimished breathe sounds at bases bilaterally , no rhonchi, no rales; hypoxic to 84% on RA at rest and 98% on 2 L. Tachypneic; speaks short sentences; in respiratory distress with mild exertion  ABD/GI: Normal bowel sounds; non-distended; soft, non-tender, no rebound, no guarding, no peritoneal signs BACK:  The back appears normal and is non-tender to palpation, there is no CVA tenderness EXT: Normal ROM  in all joints; non-tender to palpation; no edema; normal capillary refill; no cyanosis, no calf tenderness or swelling    SKIN: Normal color for age and race; warm; no rash NEURO: Moves all extremities equally, sensation to light touch intact diffusely, cranial nerves II through XII intact PSYCH: The patient's mood and manner are appropriate. Grooming and personal hygiene are appropriate.  MEDICAL DECISION MAKING: Patient here with pneumonia. Has a oral temperature of 99.8. Otherwise hemodynamically stable. Chest x-ray shows bilateral infiltrates. No sign of volume overload on exam and BNP is normal. She has chest pain with coughing only. Troponin negative. EKG shows no new ischemic abnormality. Given she does become very short of breath with increased work of breathing, wheezing with minimal activity, feel she will need admission. Her daughter at bedside agrees. We'll give ceftriaxone, azithromycin. We'll give guaifenesin with codeine for symptomatic relief, duo nebs as needed. Given no history of reactive airway, wheezing, COPD or asthma in the past, I feel we can hold on steroids.  She does have a mild  leukocytosis of 10.9. We will obtain blood cultures. We'll discuss with medicine for admission.  ED PROGRESS:   1:55 AM  Discussed patient's case with hospitalist, Dr. Lendell Caprice.  Recommend admission to inpatient, medical bed.  I will place holding orders per their request. Patient and family (if present) updated with plan. Care transferred to hospitalist service.  I reviewed all nursing notes, vitals, pertinent old records, EKGs, labs, imaging (as available).   EKG Interpretation  Date/Time:  Tuesday March 20 2016 22:10:46 EDT Ventricular Rate:  82 PR Interval:  160 QRS Duration: 124 QT Interval:  398 QTC Calculation: 464 R Axis:   -167 Text Interpretation:  Normal sinus rhythm Indeterminate axis Right bundle branch block Abnormal ECG No significant change since last tracing Confirmed by  Erron Wengert,  DO, Christyanna Mckeon [phone]) on 03/21/2016 1:15:43 AM        ,I personally performed the services described in this documentation, which was scribed in my presence. The recorded information has been reviewed and is accurate.     Layla Maw Antha Niday, DO 03/21/16 [phone]

## 2016-03-21 NOTE — ED Notes (Signed)
MD at bedside. 

## 2016-03-22 DIAGNOSIS — J189 Pneumonia, unspecified organism: Principal | ICD-10-CM

## 2016-03-22 LAB — BASIC METABOLIC PANEL
ANION GAP: 7 (ref 5–15)
BUN: 19 mg/dL (ref 6–20)
CALCIUM: 8.6 mg/dL — AB (ref 8.9–10.3)
CO2: 31 mmol/L (ref 22–32)
Chloride: 101 mmol/L (ref 101–111)
Creatinine, Ser: 0.79 mg/dL (ref 0.44–1.00)
GFR calc Af Amer: >60 mL/min (ref >60)
GFR calc non Af Amer: >60 mL/min (ref >60)
GLUCOSE: 115 mg/dL — AB (ref 65–99)
Potassium: 4.6 mmol/L (ref 3.5–5.1)
Sodium: 139 mmol/L (ref 135–145)

## 2016-03-22 LAB — GLUCOSE, CAPILLARY
GLUCOSE-CAPILLARY: 178 mg/dL — AB (ref 65–99)
Glucose-Capillary: 106 mg/dL — ABNORMAL HIGH (ref 65–99)
Glucose-Capillary: 172 mg/dL — ABNORMAL HIGH (ref 65–99)

## 2016-03-22 LAB — CBC
HEMATOCRIT: 40.5 % (ref 36.0–46.0)
Hemoglobin: 13 g/dL (ref 12.0–15.0)
MCH: 28.7 pg (ref 26.0–34.0)
MCHC: 32.1 g/dL (ref 30.0–36.0)
MCV: 89.4 fL (ref 78.0–100.0)
Platelets: 239 10*3/uL (ref 150–400)
RBC: 4.53 MIL/uL (ref 3.87–5.11)
RDW: 14.3 % (ref 11.5–15.5)
WBC: 9.4 10*3/uL (ref 4.0–10.5)

## 2016-03-22 LAB — STREP PNEUMONIAE URINARY ANTIGEN: STREP PNEUMO URINARY ANTIGEN: NEGATIVE

## 2016-03-22 LAB — HEMOGLOBIN A1C
Hgb A1c MFr Bld: 8.1 % — ABNORMAL HIGH (ref 4.8–5.6)
Mean Plasma Glucose: 186 mg/dL

## 2016-03-22 MED ORDER — LEVOFLOXACIN 750 MG PO TABS: 750.0000 mg | ORAL_TABLET | Freq: Every day | ORAL | Status: AC

## 2016-03-22 NOTE — Care Management Note (Signed)
Case Management Note  Patient Details  Name: Dorothy Hammond MRN: [id number] Date of Birth: [birth year]  Subjective/Objective:                    Action/Plan: CM received a call that patient is requesting a portable oxygen compressor for travel.  Patient is an established home oxygen user at baseline. CM and Dr Darrick Meigs met with patient to discuss.  Patient states that she already has a prescription from her PCP at [facility], but the company she is using is asking for additional information.  Dr Darrick Meigs referred patient back to her PCP to provide this information. CM encouraged patient to contact her PCP today to ensure that she is able to make arrangements in time for her trip.  No additional discharge needs identified at this time.   Expected Discharge Date:                  Expected Discharge Plan:  Home/Self Care  In-House Referral:     Discharge planning Services     Post Acute Care Choice:    Choice offered to:     DME Arranged:    DME Agency:     HH Arranged:    [place] Agency:     Status of Service:  Completed, signed off  Medicare Important Message Given:    Date Medicare IM Given:    Medicare IM give by:    Date Additional Medicare IM Given:    Additional Medicare Important Message give by:     If discussed at [place] of Stay Meetings, dates discussed:    Additional Comments:  Rolm Baptise, RN 03/22/2016, 12:35 PM [phone]

## 2016-03-22 NOTE — Clinical Documentation Improvement (Signed)
Internal Medicine  Your patient is oxygen dependent with history of diastolic heart failure; has been admitted with pneumonia. Oxygen saturations were in mid 80's on presentation with 2L; currently on 3L per ED notes. Please provide a diagnosis associated with the clinical indicators and treatment provided and document findings in next progress note. Thank you!   Document Acuity - Acute, Chronic, Acute on Chronic  Document Inclusion Of - Hypoxia, Hypercapnia, Combination of Both  Other  Clinically Undetermined  Document any associated diagnoses/conditions.  Supporting Information:  See above  Please exercise your independent, professional judgment when responding. A specific answer is not anticipated or expected.  Thank You,  Shellee MiloEileen T Tiwana Chavis RN, BSN, CCDS Health Information Management [place] [phone]; Cell: [phone]

## 2016-03-22 NOTE — Discharge Summary (Signed)
Physician Discharge Summary  Dorothy Hammond WJX:[id number] DOB: 09/26/1951 DOA: 03/21/2016  PCP: Aldona Bar, MD  Admit date: 03/21/2016 Discharge date: 03/22/2016  Time spent: 25 minutes  Recommendations for Outpatient Follow-up:  1. Follow up PCP in 1 week 2. PCP to follow-up on the labs urine strep pneumo antigen, Legionella urine antigen.  Discharge Diagnoses:  Active Problems:   CAP (community acquired pneumonia)   Pneumonia   Discharge Condition: Stable  Diet recommendation: Low salt diet  Filed Weights   03/20/16 2212 03/21/16 0400  Weight: 115.214 kg (254 lb) 113.127 kg (249 lb 6.4 oz)    History of present illness:  65 y.o. female past medical history significant for morbid obesity, hypertension, insulin-dependent diabetes mellitus and diastolic congestive heart failure [facility] because she said over the past 4 days patient's been having a cough productive of yellowish sputum associated yesterday with some chills, subjective fever. Low energy and has been having associated wheezing. Does not normally carry the diagnosis of asthma or COPD. Does not have any breathing treatments at home. I said after she was diagnosed with CHF she does require 2 L of oxygen continuously. She's been wearing that but said that she gets exertional dyspnea with minimal activity. [place] recently her granddaughter had an ear infection and fever. Said that anytime her granddaughter gets sick the patient develops similar symptoms. She was lasted minutes of a hospital in about December January for similar problem. Patient says she chronically sleeps with her head elevated, has gained about 6 pounds in the past month, reported some mild lower extremity edema. She's been taking her 40 mg of Lasix daily as prescribed.  Hospital Course:  In the emergency room patient has been afebrile, she's currently on 3 L nasal cannula saturating 97% but according to the ED when she came in on room air she was in the  mid 80s. Seizure normal blood cell count of 10.9, metabolic panel showed mildly elevated creatinine of 1.14 and a glucose of 212. Troponin was 0, brain x-ray peptide was normal at 26. Chest x-ray interpreted as patchy bilateral infiltrates consistent with pneumonia. Patient was started on a dose of ceftriaxone and azithromycin in the hospital was called to admit.  1. Bilateral community-acquired pneumonia. Has not been hospitalized in the past 3 months, came to hospital from home.  Her dyspnea  likely multifactorial given her prior history of diastolic CHF. She had a normal BNP and troponin was 0. Morbid obesity and pickwickian syndrome likely contributing. Patient has improved, Influenza PCR is negative. Urinary strep pneumo antigen is pending, Legionella urinary antigen also pending.  Will discharge the patient on Levaquin 750 mg by mouth daily for 8 more days.  2. Morbid obesity. Complicates care, encourage diet and lifestyle modification   3. Hypertension. Blood pressure controlled at this time, continue amlodipine 10 mg daily losartan dose change to 100 mg by mouth daily  4. Diastolic congestive heart failure. Patient had an echocardiogram done in September 2016 which showed diastolic CHF with an EF of 65-70%. Continue patients antihypertensives including Lasix 40 mg daily as above. BNP normal at 26, troponin was 0.  5. Insulin-dependent diabetes mellitus. Continue Lantus 40 units subcutaneous twice a day. NovoLog flex pen 20 units subcutaneous 3 times a day with meals  Procedures:  None  Consultations:  None  Discharge Exam: Filed Vitals:   03/22/16 0558 03/22/16 0948  BP: 117/60 138/58  Pulse: 60 72  Temp: 98.4 F (36.9 C) 98.6 F (37 C)  Resp: 17  20    General: Appears in no acute distress Cardiovascular: S1-S2 regular Respiratory: Clear to auscultation bilaterally  Discharge Instructions   Discharge Instructions    Diet - low sodium heart healthy    Complete by:  As  directed      Increase activity slowly    Complete by:  As directed           Current Discharge Medication List    START taking these medications   Details  levofloxacin (LEVAQUIN) 750 MG tablet Take 1 tablet (750 mg total) by mouth daily. Qty: 8 tablet, Refills: 0      CONTINUE these medications which have NOT CHANGED   Details  albuterol (PROAIR HFA) 108 (90 BASE) MCG/ACT inhaler Inhale 2 puffs into the lungs every 6 (six) hours as needed for wheezing or shortness of breath.    amLODipine (NORVASC) 10 MG tablet Take 10 mg by mouth at bedtime.     aspirin EC 81 MG tablet Take 81 mg by mouth at bedtime.     Cholecalciferol (VITAMIN D) 2000 UNITS CAPS Take 2,000 Units by mouth daily.    clobetasol (TEMOVATE) 0.05 % GEL Apply 1 application topically 2 (two) times daily. Refills: 3    fexofenadine (ALLEGRA) 180 MG tablet Take 180 mg by mouth daily as needed for allergies or rhinitis.    fluticasone (FLONASE) 50 MCG/ACT nasal spray Place 1 spray into both nostrils 2 (two) times daily.    furosemide (LASIX) 40 MG tablet Take 1 tablet (40 mg total) by mouth daily. Qty: 30 tablet, Refills: 0    insulin aspart (NOVOLOG FLEXPEN) 100 UNIT/ML FlexPen Inject 20 Units into the skin 3 (three) times daily with meals.     insulin glargine (LANTUS) 100 unit/mL SOPN Inject 0.4 mLs (40 Units total) into the skin 2 (two) times daily. Qty: 15 mL, Refills: 11    losartan (COZAAR) 100 MG tablet Take 100 mg by mouth daily.    traMADol (ULTRAM) 50 MG tablet Take 50 mg by mouth every 12 (twelve) hours as needed for moderate pain.      STOP taking these medications     pseudoephedrine (SUDAFED) 30 MG tablet        Allergies  Allergen Reactions  . Metformin And Related Diarrhea and Other (See Comments)    Stomach pains also   Follow-up Information    [facility]   Contact information:   [address] [place] [id number] [phone]        The  results of significant diagnostics from this hospitalization (including imaging, microbiology, ancillary and laboratory) are listed below for reference.    Significant Diagnostic Studies: Dg Chest 2 View  03/20/2016  CLINICAL DATA:  Acute onset of shortness of breath, wheezing and productive cough. Initial encounter. EXAM: CHEST  2 VIEW COMPARISON:  Chest radiograph from 11/06/2015 FINDINGS: The lungs are well-aerated. Patchy bilateral airspace opacities are compatible with pneumonia. There is no evidence of pleural effusion or pneumothorax. The heart is borderline normal in size. No acute osseous abnormalities are seen. Clips are noted within the right upper quadrant, reflecting prior cholecystectomy. IMPRESSION: Patchy bilateral airspace opacities are compatible with pneumonia. Followup PA and lateral chest X-ray is recommended in 3-4 weeks following trial of antibiotic therapy to ensure resolution and exclude underlying malignancy. Electronically Signed   By: Roanna RaiderJeffery  Chang M.D.   On: 03/20/2016 22:43    Microbiology: Recent Results (from the past 240 hour(s))  Blood culture (  routine x 2)     Status: None (Preliminary result)   Collection Time: 03/21/16  1:30 AM  Result Value Ref Range Status   Specimen Description BLOOD LEFT FOREARM  Final   Special Requests AEROBIC BOTTLE ONLY  Final   Culture NO GROWTH 1 DAY  Final   Report Status PENDING  Incomplete  Blood culture (routine x 2)     Status: None (Preliminary result)   Collection Time: 03/21/16  1:42 AM  Result Value Ref Range Status   Specimen Description BLOOD RIGHT ARM  Final   Special Requests AEROBIC BOTTLE ONLY  Final   Culture NO GROWTH 1 DAY  Final   Report Status PENDING  Incomplete     Labs: Basic Metabolic Panel:  Recent Labs Lab 03/20/16 2207 03/22/16 0632  NA 137 139  K 3.9 4.6  CL 99* 101  CO2 28 31  GLUCOSE 212* 115*  BUN 20 19  CREATININE 1.14* 0.79  CALCIUM 9.0 8.6*   Liver Function Tests: No  results for input(s): AST, ALT, ALKPHOS, BILITOT, PROT, ALBUMIN in the last 168 hours. No results for input(s): LIPASE, AMYLASE in the last 168 hours. No results for input(s): AMMONIA in the last 168 hours. CBC:  Recent Labs Lab 03/20/16 2207 03/21/16 0526 03/22/16 0632  WBC 10.9* 12.0* 9.4  HGB 14.0 13.1 13.0  HCT 43.2 40.5 40.5  MCV 87.4 87.7 89.4  PLT 252 226 239    BNP: BNP (last 3 results)  Recent Labs  08/02/15 0203 11/06/15 2006 03/20/16 2207  BNP 36.9 35.0 29.6    ProBNP (last 3 results) No results for input(s): PROBNP in the last 8760 hours.  CBG:  Recent Labs Lab 03/21/16 1135 03/21/16 1657 03/21/16 2229 03/22/16 0639 03/22/16 1108  GLUCAP 207* 176* 267* 106* 178*       Signed:  Mauro Kaufmann S MD.  [facility] 03/22/2016, 12:35 PM

## 2016-03-22 NOTE — Progress Notes (Signed)
Pt discharged. IV removed. Discharge instructions reviewed by first shift RN. Pt taken to car in wheelchair.

## 2016-03-22 NOTE — Progress Notes (Signed)
Inpatient Diabetes Program Recommendations  AACE/ADA: New Consensus Statement on Inpatient Glycemic Control (2015)  Target Ranges:  Prepandial:   less than 140 mg/dL      Peak postprandial:   less than 180 mg/dL (1-2 hours)      Critically ill patients:  140 - 180 mg/dL  Results for Dorothy Hammond, Dorothy Hammond (MRN [id number]) as of 03/22/2016 11:19  Ref. Range 03/21/2016 06:34 03/21/2016 11:35 03/21/2016 16:57 03/21/2016 22:29 03/22/2016 06:39 03/22/2016 11:08  Glucose-Capillary Latest Ref Range: 65-99 mg/dL 132174 (H) 440207 (H) 102176 (H) 267 (H) 106 (H) 178 (H)   Review of Glycemic Control  Diabetes history: DM2 Outpatient Diabetes medications: Lantus 40 units BID, Novolog 20 units TID with meals Current orders for Inpatient glycemic control: Lantus 40 units BID, Novolog 0-20 units TID with meals, Novolog 0-5 units QHS, Novolog 6 units TID with meals for meal coverage  Inpatient Diabetes Program Recommendations: Insulin - Meal Coverage: Please consider increasing meal coverage to Novolog 10 units TID with meals.  Thanks, Orlando PennerMarie Israa Caban, RN, MSN, CDE Diabetes Coordinator Inpatient Diabetes Program [phone] (Team Pager from 8am to 5pm) [phone] ([facility] office) [phone] [facility] office) [phone] [facility] office)

## 2016-03-23 LAB — LEGIONELLA PNEUMOPHILA SEROGP 1 UR AG: L. pneumophila Serogp 1 Ur Ag: NEGATIVE

## 2016-03-26 LAB — CULTURE, BLOOD (ROUTINE X 2)
Culture: NO GROWTH
Culture: NO GROWTH

## 2016-06-26 ENCOUNTER — Emergency Department (HOSPITAL_COMMUNITY)
Admission: EM | Admit: 2016-06-26 | Discharge: 2016-06-27 | Disposition: A | Discharge status: Home / Self Care | Payer: Medicare Other | Attending: Emergency Medicine | Admitting: Emergency Medicine

## 2016-06-26 ENCOUNTER — Emergency Department (HOSPITAL_COMMUNITY): Payer: Medicare Other

## 2016-06-26 ENCOUNTER — Encounter (HOSPITAL_COMMUNITY): Payer: Self-pay | Admitting: Emergency Medicine

## 2016-06-26 DIAGNOSIS — I110 Hypertensive heart disease with heart failure: Secondary | ICD-10-CM | POA: Insufficient documentation

## 2016-06-26 DIAGNOSIS — E119 Type 2 diabetes mellitus without complications: Secondary | ICD-10-CM | POA: Insufficient documentation

## 2016-06-26 DIAGNOSIS — Z79899 Other long term (current) drug therapy: Secondary | ICD-10-CM | POA: Insufficient documentation

## 2016-06-26 DIAGNOSIS — R0602 Shortness of breath: Secondary | ICD-10-CM | POA: Insufficient documentation

## 2016-06-26 DIAGNOSIS — Z7982 Long term (current) use of aspirin: Secondary | ICD-10-CM | POA: Insufficient documentation

## 2016-06-26 DIAGNOSIS — Z794 Long term (current) use of insulin: Secondary | ICD-10-CM | POA: Insufficient documentation

## 2016-06-26 DIAGNOSIS — I5031 Acute diastolic (congestive) heart failure: Secondary | ICD-10-CM | POA: Diagnosis not present

## 2016-06-26 DIAGNOSIS — R600 Localized edema: Secondary | ICD-10-CM | POA: Diagnosis not present

## 2016-06-26 DIAGNOSIS — J449 Chronic obstructive pulmonary disease, unspecified: Secondary | ICD-10-CM | POA: Insufficient documentation

## 2016-06-26 HISTORY — DX: Heart failure, unspecified: I50.9

## 2016-06-26 HISTORY — DX: Obesity, unspecified: E66.9

## 2016-06-26 LAB — BASIC METABOLIC PANEL
ANION GAP: 7 (ref 5–15)
BUN: 10 mg/dL (ref 6–20)
CALCIUM: 8.8 mg/dL — AB (ref 8.9–10.3)
CO2: 27 mmol/L (ref 22–32)
CREATININE: 0.73 mg/dL (ref 0.44–1.00)
Chloride: 100 mmol/L — ABNORMAL LOW (ref 101–111)
Glucose, Bld: 224 mg/dL — ABNORMAL HIGH (ref 65–99)
Potassium: 3.6 mmol/L (ref 3.5–5.1)
SODIUM: 134 mmol/L — AB (ref 135–145)

## 2016-06-26 LAB — CBC
HCT: 43.6 % (ref 36.0–46.0)
HEMOGLOBIN: 13.9 g/dL (ref 12.0–15.0)
MCH: 28.7 pg (ref 26.0–34.0)
MCHC: 31.9 g/dL (ref 30.0–36.0)
MCV: 90.1 fL (ref 78.0–100.0)
PLATELETS: 275 10*3/uL (ref 150–400)
RBC: 4.84 MIL/uL (ref 3.87–5.11)
RDW: 14.3 % (ref 11.5–15.5)
WBC: 8.8 10*3/uL (ref 4.0–10.5)

## 2016-06-26 LAB — BRAIN NATRIURETIC PEPTIDE: B Natriuretic Peptide: 45.8 pg/mL (ref 0.0–100.0)

## 2016-06-26 LAB — I-STAT TROPONIN, ED: TROPONIN I, POC: 0 ng/mL (ref 0.00–0.08)

## 2016-06-26 NOTE — ED Triage Notes (Signed)
Pt. reports exertional dyspnea with dry cough , feet/hands swelling and weight gain onset this week , denies fever or chills.

## 2016-06-26 NOTE — ED Provider Notes (Signed)
[facility]-EMERGENCY DEPT Provider Note   CSN: [id number] Arrival date & time: 06/26/16  2050  First Provider Contact:   First MD Initiated Contact with Patient 06/27/16 0018      By signing my name below, I, Dorothy Hammond, attest that this documentation has been prepared under the direction and in the presence of Geoffery Lyons, MD.  Electronically Signed: Octavia Hammond, ED Scribe. 06/27/16. 12:29 AM.  History   Chief Complaint Chief Complaint  Patient presents with  . Shortness of Breath   The history is provided by the patient. No language interpreter was used.   HPI Comments: Dorothy Hammond is a 65 y.o. female who has a PMHx of CHF, COPD, DM, and HTN presents to the Emergency Department complaining of constant, gradual worsening, moderate, shortness of breath upon exertion onset one week ago. She notes associated dry cough, bilateral leg and hand swelling that decreases throughout the day. Pt is also 20 lbs higher than her normal weight since the last time she saw her PCP. Per relative, pt has been having difficulty ambulating short distances due to increased shortness of breath. Pt is also using her oxygen once a day to help with her breathing which is more than normal. She was trying to get an appointment with her PCP this past week but was unable too. She is currently on lasix BID to help with her leg swelling but she has not gotten any better. She denies fever, hx of DVT, abdominal pain.  Past Medical History:  Diagnosis Date  . CHF (congestive heart failure) (HCC)   . COPD (chronic obstructive pulmonary disease) (HCC)   . Diabetes mellitus without complication (HCC)   . HTN (hypertension)   . Obesity     Patient Active Problem List   Diagnosis Date Noted  . Pneumonia 03/21/2016  . DOE (dyspnea on exertion)   . Nasal congestion   . CHF (congestive heart failure) (HCC) 11/06/2015  . Acute exacerbation of CHF (congestive heart failure) (HCC) 11/06/2015  . BMI 60.0-69.9, adult (HCC)  11/06/2015  . Acute diastolic CHF (congestive heart failure) (HCC) 11/06/2015  . CAP (community acquired pneumonia) 08/02/2015  . DM2 (diabetes mellitus, type 2) (HCC) 08/02/2015  . HTN (hypertension), benign 08/02/2015    Past Surgical History:  Procedure Laterality Date  . CESAREAN SECTION    . CHOLECYSTECTOMY      OB History    No data available       Home Medications    Prior to Admission medications   Medication Sig Start Date End Date Taking? Authorizing Provider  albuterol (PROAIR HFA) 108 (90 BASE) MCG/ACT inhaler Inhale 2 puffs into the lungs every 6 (six) hours as needed for wheezing or shortness of breath.    Historical Provider, MD  amLODipine (NORVASC) 10 MG tablet Take 10 mg by mouth at bedtime.     Historical Provider, MD  aspirin EC 81 MG tablet Take 81 mg by mouth at bedtime.     Historical Provider, MD  Cholecalciferol (VITAMIN D) 2000 UNITS CAPS Take 2,000 Units by mouth daily.    Historical Provider, MD  clobetasol (TEMOVATE) 0.05 % GEL Apply 1 application topically 2 (two) times daily. 10/19/15   Historical Provider, MD  fexofenadine (ALLEGRA) 180 MG tablet Take 180 mg by mouth daily as needed for allergies or rhinitis.    Historical Provider, MD  fluticasone (FLONASE) 50 MCG/ACT nasal spray Place 1 spray into both nostrils 2 (two) times daily.    Historical Provider, MD  furosemide (LASIX) 40 MG tablet Take 1 tablet (40 mg total) by mouth daily. 11/10/15   Maryann Mikhail, DO  insulin aspart (NOVOLOG FLEXPEN) 100 UNIT/ML FlexPen Inject 20 Units into the skin 3 (three) times daily with meals.     Historical Provider, MD  insulin glargine (LANTUS) 100 unit/mL SOPN Inject 0.4 mLs (40 Units total) into the skin 2 (two) times daily. 11/10/15   Maryann Mikhail, DO  levofloxacin (LEVAQUIN) 750 MG tablet Take 1 tablet (750 mg total) by mouth daily. 03/22/16   Meredeth Ide, MD  losartan (COZAAR) 100 MG tablet Take 100 mg by mouth daily.    Historical Provider, MD    traMADol (ULTRAM) 50 MG tablet Take 50 mg by mouth every 12 (twelve) hours as needed for moderate pain.    Historical Provider, MD    Family History No family history on file.  Social History Social History  Substance Use Topics  . Smoking status: Never Smoker  . Smokeless tobacco: Not on file  . Alcohol use No     Allergies   Metformin and related   Review of Systems Review of Systems A complete 10 system review of systems was obtained and all systems are negative except as noted in the HPI and PMH.   Physical Exam Updated Vital Signs BP 151/57 (BP Location: Left Arm)   Pulse 73   Temp 98.3 F (36.8 C) (Oral)   Resp 18   SpO2 94%   Physical Exam  Constitutional: She is oriented to person, place, and time. She appears well-developed and well-nourished. No distress.  Morbidly obese  HENT:  Head: Normocephalic and atraumatic.  Eyes: EOM are normal.  Neck: Normal range of motion.  Cardiovascular: Normal rate, regular rhythm and normal heart sounds.   Pulmonary/Chest: Effort normal and breath sounds normal.  Abdominal: Soft. She exhibits no distension. There is no tenderness.  Musculoskeletal: Normal range of motion. She exhibits edema.  2+ pitting edema in bilateral lower extremities  Neurological: She is alert and oriented to person, place, and time.  Skin: Skin is warm and dry.  Psychiatric: She has a normal mood and affect. Judgment normal.  Nursing note and vitals reviewed.    ED Treatments / Results  DIAGNOSTIC STUDIES: Oxygen Saturation is 94% on RA, low by my interpretation.  COORDINATION OF CARE:  12:27 AM Will order CT of chest. Discussed treatment plan which includes dose of lasix with pt at bedside and pt agreed to plan.  Labs (all labs ordered are listed, but only abnormal results are displayed) Labs Reviewed  BASIC METABOLIC PANEL - Abnormal; Notable for the following:       Result Value   Sodium 134 (*)    Chloride 100 (*)    Glucose, Bld  224 (*)    Calcium 8.8 (*)    All other components within normal limits  CBC  BRAIN NATRIURETIC PEPTIDE  I-STAT TROPOININ, ED    EKG  EKG Interpretation None       Radiology Dg Chest 2 View  Result Date: 06/26/2016 CLINICAL DATA:  Shortness of breath 1 week. EXAM: CHEST  2 VIEW COMPARISON:  03/20/2016 FINDINGS: Lungs are adequately inflated demonstrate no lobar consolidation or effusion. There is mild prominence of the perihilar markings compatible mild vascular congestion. Mild stable cardiomegaly. Mild degenerate change of the spine. Degenerative change of the left shoulder. IMPRESSION: Mild cardiomegaly with mild vascular congestion. Electronically Signed   By: Elberta Fortis M.D.   On: 06/26/2016 21:29  Procedures Procedures (including critical care time)  Medications Ordered in ED Medications - No data to display   Initial Impression / Assessment and Plan / ED Course  I have reviewed the triage vital signs and the nursing notes.  Pertinent labs & imaging results that were available during my care of the patient were reviewed by me and considered in my medical decision making (see chart for details).  Clinical Course      Final Clinical Impressions(s) / ED Diagnoses   Final diagnoses:  None   Patient presents here with complaints of shortness of breath. She has a history of congestive heart failure. Her workup today reveals a normal BNP plus chest x-ray that is not suggestive of significant CHF or edema. She reports a 19 pound weight gain in the past 1-2 weeks and this is not improving with an increased dose of Lasix at home.  She was given IV Lasix in the emergency department with a good diuresis. She is not hypoxic and I see no reason for admission. I will increase her Lasix further and have her follow-up with her primary doctor.  I personally performed the services described in this documentation, which was scribed in my presence. The recorded information has  been reviewed and is accurate.     New Prescriptions New Prescriptions   No medications on file     Geoffery Lyons, MD 06/27/16 [phone]

## 2016-06-27 ENCOUNTER — Emergency Department (HOSPITAL_COMMUNITY): Payer: Medicare Other

## 2016-06-27 DIAGNOSIS — R0602 Shortness of breath: Secondary | ICD-10-CM | POA: Diagnosis not present

## 2016-06-27 MED ORDER — FUROSEMIDE 10 MG/ML IJ SOLN
40.0000 mg | Freq: Once | INTRAMUSCULAR | Status: AC
  Administered 2016-06-27: 40 mg via INTRAVENOUS
  Filled 2016-06-27: qty 4

## 2016-06-27 MED ORDER — IOPAMIDOL (ISOVUE-370) INJECTION 76%
INTRAVENOUS | Status: AC
  Administered 2016-06-27: 100 mL
  Filled 2016-06-27: qty 100

## 2016-06-27 NOTE — Discharge Instructions (Signed)
Increase your Lasix to 80 mg in the morning and 40 mg in the afternoon for the next 3-4 days.  Follow-up with your primary Dr. for recheck in the next 3-4 days, and return to the ER if your symptoms significantly worsen or change.

## 2016-06-27 NOTE — ED Notes (Signed)
Family at bedside. 

## 2016-12-27 DEATH — deceased

## 2017-07-28 IMAGING — CR DG CHEST 2V
2 series · 2 of 2 positions shown · non-contrast
Comparison: August 02, 2015.

CLINICAL DATA: Shortness of breath.

EXAM:
CHEST  2 VIEW

[chest pa]
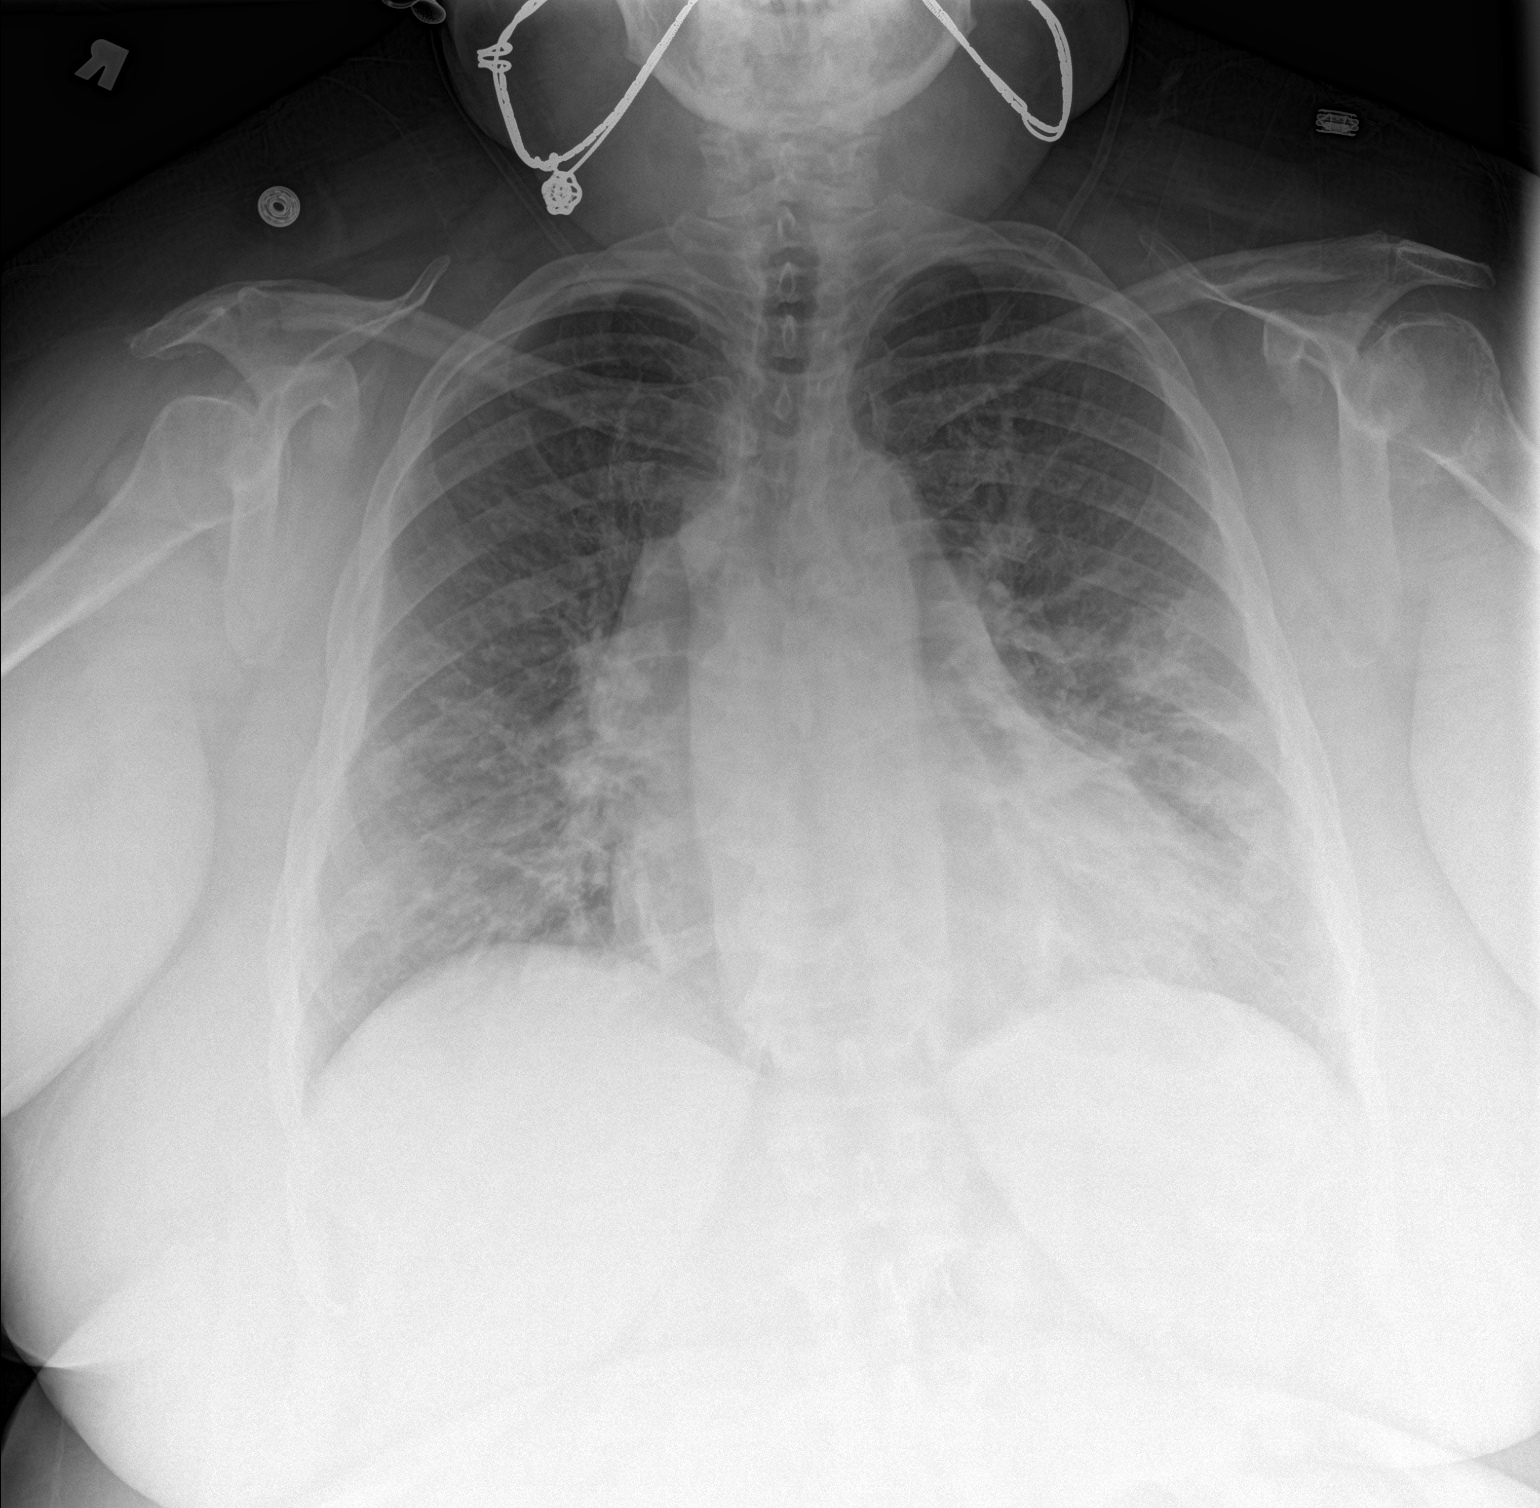

[chest lat]
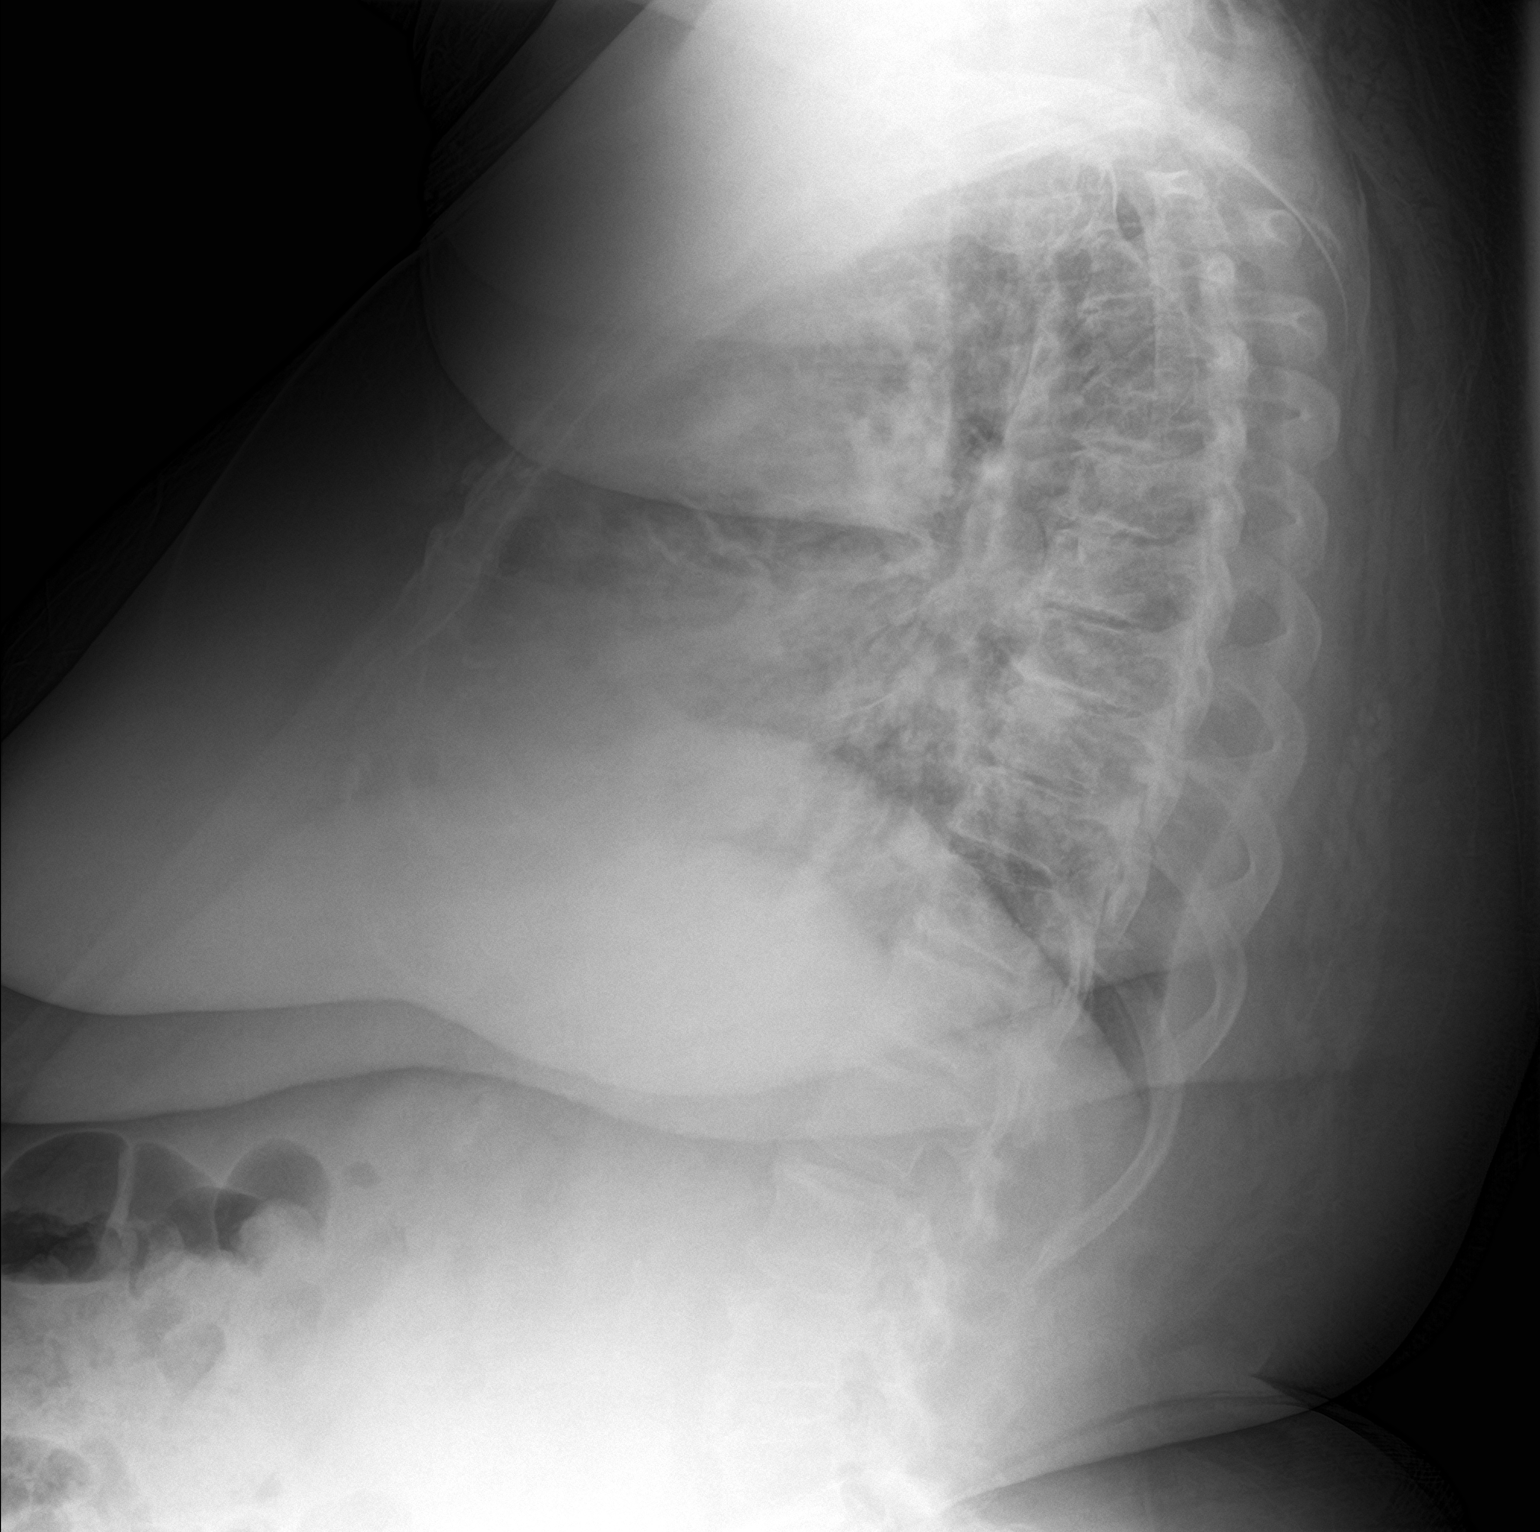

[2 of 2 positions shown; findings below may reference images not displayed]

FINDINGS: Stable cardiomediastinal silhouette. Central pulmonary vascular
congestion is noted with bilateral perihilar and basilar
interstitial densities concerning for edema. No pneumothorax or
pleural effusion is noted. Bony thorax is intact.
IMPRESSION: Central pulmonary vascular congestion and increased bilateral
perihilar and basilar densities are noted concerning for pulmonary
edema.
